# Patient Record
Sex: Female | Born: 1990 | Race: Black or African American | Hispanic: No | Marital: Single | Smoking: Never smoker
Health system: Southern US, Community
[De-identification: ages and names within clinical notes are randomized; demographics above are authoritative.]

## PROBLEM LIST (undated history)

## (undated) DIAGNOSIS — L509 Urticaria, unspecified: Secondary | ICD-10-CM

## (undated) DIAGNOSIS — D649 Anemia, unspecified: Secondary | ICD-10-CM

## (undated) DIAGNOSIS — T8859XA Other complications of anesthesia, initial encounter: Secondary | ICD-10-CM

## (undated) DIAGNOSIS — T4145XA Adverse effect of unspecified anesthetic, initial encounter: Secondary | ICD-10-CM

## (undated) DIAGNOSIS — M419 Scoliosis, unspecified: Secondary | ICD-10-CM

## (undated) HISTORY — DX: Urticaria, unspecified: L50.9

## (undated) HISTORY — PX: OTHER SURGICAL HISTORY: SHX169

---
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
## 2009-01-03 ENCOUNTER — Emergency Department (HOSPITAL_COMMUNITY): Admission: EM | Admit: 2009-01-03 | Discharge: 2009-01-03 | Payer: Self-pay | Admitting: Family Medicine

## 2009-10-04 ENCOUNTER — Emergency Department (HOSPITAL_COMMUNITY): Admission: EM | Admit: 2009-10-04 | Discharge: 2009-10-04 | Payer: Self-pay | Admitting: Emergency Medicine

## 2009-12-28 ENCOUNTER — Emergency Department (HOSPITAL_COMMUNITY): Admission: EM | Admit: 2009-12-28 | Discharge: 2009-12-28 | Payer: Self-pay | Admitting: Emergency Medicine

## 2010-01-07 ENCOUNTER — Emergency Department (HOSPITAL_COMMUNITY): Admission: EM | Admit: 2010-01-07 | Discharge: 2010-01-07 | Payer: Self-pay | Admitting: Family Medicine

## 2010-02-09 ENCOUNTER — Emergency Department (HOSPITAL_COMMUNITY): Admission: EM | Admit: 2010-02-09 | Discharge: 2010-02-09 | Payer: Self-pay | Admitting: Family Medicine

## 2010-03-08 ENCOUNTER — Emergency Department (HOSPITAL_COMMUNITY): Admission: EM | Admit: 2010-03-08 | Discharge: 2010-03-08 | Payer: Self-pay | Admitting: Family Medicine

## 2010-07-24 ENCOUNTER — Emergency Department (HOSPITAL_COMMUNITY)
Admission: EM | Admit: 2010-07-24 | Discharge: 2010-07-24 | Payer: Self-pay | Source: Home / Self Care | Admitting: Emergency Medicine

## 2010-10-14 LAB — GC/CHLAMYDIA PROBE AMP, GENITAL
Chlamydia, DNA Probe: NEGATIVE
GC Probe Amp, Genital: NEGATIVE

## 2010-10-14 LAB — POCT URINALYSIS DIPSTICK
Bilirubin Urine: NEGATIVE
Glucose, UA: NEGATIVE mg/dL
Nitrite: NEGATIVE
Protein, ur: NEGATIVE mg/dL
Specific Gravity, Urine: 1.02 (ref 1.005–1.030)
Urobilinogen, UA: 0.2 mg/dL (ref 0.0–1.0)
pH: 7.5 (ref 5.0–8.0)

## 2010-10-14 LAB — POCT PREGNANCY, URINE: Preg Test, Ur: NEGATIVE

## 2010-10-20 LAB — WET PREP, GENITAL
Trich, Wet Prep: NONE SEEN
Yeast Wet Prep HPF POC: NONE SEEN

## 2010-10-20 LAB — POCT URINALYSIS DIP (DEVICE)
Bilirubin Urine: NEGATIVE
Ketones, ur: 40 mg/dL — AB
Protein, ur: NEGATIVE mg/dL
Specific Gravity, Urine: >=1.03 (ref 1.005–1.030)
pH: 5.5 (ref 5.0–8.0)

## 2010-10-20 LAB — GC/CHLAMYDIA PROBE AMP, GENITAL: Chlamydia, DNA Probe: NEGATIVE

## 2010-10-21 LAB — GC/CHLAMYDIA PROBE AMP, GENITAL
Chlamydia, DNA Probe: NEGATIVE
GC Probe Amp, Genital: NEGATIVE
GC Probe Amp, Genital: POSITIVE — AB

## 2010-10-21 LAB — POCT URINALYSIS DIP (DEVICE)
Glucose, UA: NEGATIVE mg/dL
Hgb urine dipstick: NEGATIVE
Nitrite: NEGATIVE
Protein, ur: NEGATIVE mg/dL
Specific Gravity, Urine: 1.02 (ref 1.005–1.030)
Urobilinogen, UA: 0.2 mg/dL (ref 0.0–1.0)

## 2010-10-21 LAB — POCT PREGNANCY, URINE
Preg Test, Ur: NEGATIVE
Preg Test, Ur: NEGATIVE

## 2010-10-21 LAB — WET PREP, GENITAL: Trich, Wet Prep: NONE SEEN

## 2010-11-11 LAB — POCT URINALYSIS DIP (DEVICE)
Nitrite: POSITIVE — AB
Protein, ur: 100 mg/dL — AB
Urobilinogen, UA: 0.2 mg/dL (ref 0.0–1.0)
pH: 5.5 (ref 5.0–8.0)

## 2010-11-11 LAB — POCT PREGNANCY, URINE: Preg Test, Ur: NEGATIVE

## 2010-11-11 LAB — URINE CULTURE: Colony Count: >=100000

## 2010-11-11 LAB — GC/CHLAMYDIA PROBE AMP, GENITAL: GC Probe Amp, Genital: NEGATIVE

## 2010-12-20 NOTE — Discharge Summary (Signed)
Melanie Calderon, Melanie Calderon            ACCOUNT NO.:  [id number]   MEDICAL RECORD NO.:  [id number]          PATIENT TYPE:  INP   LOCATION:  9145                          FACILITY:  [facility]   PHYSICIAN:  Maxie Better, M.D.DATE OF BIRTH:  [birth year]   DATE OF ADMISSION:  01/18/2005  DATE OF DISCHARGE:  01/22/2005                                 DISCHARGE SUMMARY   ADMISSION DIAGNOSES:  1.  Spontaneous rupture of membranes, early labor.  2.  Intrauterine gestation at 37-1/7 weeks.  3.  Teen pregnancy.   DISCHARGE DIAGNOSES:  1.  Intrauterine gestation at 37-1/7 weeks delivered.  2.  Chorioamnionitis.  3.  Arrested dilatation.  4.  Teen pregnancy.  5.  Left paratubal cyst.  6.  Status post primary cesarean section.   PROCEDURES:  Primary cesarean section, left paratubal cyst removal   HISTORY OF PRESENT ILLNESS:  A 20 year old gravida 1, para 0, single black  female at 37-1/7 weeks who has had late prenatal care registration.  Presented at [facility] with spontaneous rupture of membranes and  early labor.   HOSPITAL COURSE:  The patient was admitted to [facility].  Her exam,  at time of admission, was 3, 80%, -3.  Blood type is O positive.  She was  rubella immune.  Her tracing showed a fetal heart rate baseline of 120,  reactivity and dysfunctional uterine pattern.  Group B strep culture was  negative.  Low dose pitocin was started.  Internal scalp electrode was  placed, and during the labor course secondary to external monitoring, showed  some late decelerations.  Pitocin was discontinued at that time.  Temperature was 100.4.  The patient was 4 cm, edematous, -3.  She was  contracting every 2-4 minutes.  Intrauterine pressure catheter was placed.  Urine culture was sent which showed no growth ultimately.  Ampicillin and  gentamycin were started.  Tylenol was given.  The patient progressed to  anterior lip, -1 and arrested at that dilatation.  With arrested  dilatation,  decision was made to proceed with a primary cesarean section.  At the time  of her symptoms, direct occiput posterior presentation of a live female  weighing 6 pounds 9 ounces with cord around his shoulder times one was  noted.  Short cord was noted.  Normal tubes and ovaries were noted.  Left  paratubal cyst was noted.  Apgars were 6 and 7.  Cord pH was 7.29.  Blood  loss was 70 cc.  Placenta was sent to pathology which confirmed  chorioamnionitis and funisitis and left paratubal cyst which had been  removed at the time of surgery, was confirmed as a paratubal cyst.  The  patient, postoperatively, was continued on her antibiotics as she was  afebrile for greater than 24 hours.  Her CBC on postop day #1 showed a  hemoglobin of 11.3, white count 15, platelets 185,000.  By postop day #3,  the patient remained afebrile, had no evidence of infection, was able to be  discharged home.  Social worker saw the patient and her mom.   DISPOSITION:  Home.   CONDITION ON  DISCHARGE:  Stable.   DISCHARGE MEDICATIONS:  1.  Tylox #30 one p.o. q.6h. p.r.n. pain.  2.  Prenatal vitamins one p.o. daily.  3.  Motrin 800 mg one p.o. q.6-8h. p.r.n. pain.   DISCHARGE INSTRUCTIONS:  1.  Postpartum booklet given.  2.  Follow up appointment in four weeks at [facility].       [place]/MEDQ  D:  02/11/2005  T:  02/11/2005  Job:  [id number]

## 2010-12-20 NOTE — H&P (Signed)
Melanie Calderon, Melanie Calderon            ACCOUNT NO.:  [id number]   MEDICAL RECORD NO.:  [id number]          PATIENT TYPE:  INP   LOCATION:  0104                          FACILITY:  [facility]   PHYSICIAN:  Lalla Brothers, MDDATE OF BIRTH:  27-May-1991   DATE OF ADMISSION:  02/10/2005  DATE OF DISCHARGE:                         PSYCHIATRIC ADMISSION ASSESSMENT   IDENTIFICATION:  This 40-1/21-year-old female, entering the 10th grade this  fall, is admitted emergently voluntarily in transfer from [facility] [phone], Darel Hong, for inpatient psychiatric stabilization  and treatment of suicide risk and depression. The patient has suicide  intent, pulling a knife to stab herself to death. She has been progressively  depressed recently, being three weeks postpartum with a teen pregnancy,  having guilty ruminations that mother considers her inadequate as mother  helps her significantly with the baby.   HISTORY OF PRESENT ILLNESS:  The father of the baby is uninvolved with the  patient seeming to sense significant rejection in that regard. The patient's  mother is retiring from [employer] and is on Seroquel and Prozac herself  for depression and anxiety. Mother also has diabetes. The patient entered  [facility] with premature rupture of the membranes concluded to be  associated with chorioamnionitis with delivery accomplished by cesarean  section for failure to progress in labor January 19, 2005. The patient had a  peritubular cyst that was also excised. She has not been eating or drinking  well recently as evidenced by ketones greater than 80 on her admission  urinalysis. The patient has restored her hemoglobin quite well but she is  not taking her prenatal vitamin. Mother encourages nutrition and hydration  in the patient but the patient seems to respond to mother's encouragement by  considering herself an inadequate mother. The patient has become  progressively  depressed. Her weight is only 117 pounds three weeks  postpartum and she continues to have postpartum bleeding. Mother may be  somewhat perfectionistic in her Engineer, agricultural style of formulating the  patient's needs and the optimal meeting of these needs. However, the patient  and mother do not open up and discuss these issues readily so that  formulation can only be initiated without definite clarification yet. The  patient predominately lays there and cries. She has reached a point of being  unable to tolerate further despair and guilty rumination. She is self-  deprecating and seems to project part of this to mother. She seems hopeless  and wants to give her baby up. Mother is currently taking care of the baby  but the patient even feels bad about that. The patient does not open up and  talk about the stress of parenting the baby. She describes that she has  actually been a good mother even though she doubts that her own mother  thinks this. The patient indicates that patient's responsibilities are  manageable but she does not feel a sense of reinforcement and recruitment  for continuing these. However, she has not been lax in her own  responsibilities until she has decided to kill herself to establish a final  answer. The  patient has inflicted eraser burns into her ankle, resulting in  the letters M and K.. She does not currently clarify who these stand for or  the name of the baby's father. She has had no previous mental health  diagnosis or treatment. Mother does not clarify definite postpartum mood  disorder herself. The patient does not use alcohol or illicit drugs. The  patient's guilty ruminations and projection that mother considers her  inadequate may be reaching delusional proportions and therefore the  differential diagnosis must consider that a postpartum psychosis. The  patient does not manifest any affective recruitment when questions about  such are raised. She denies  misperceptions or frank paranoia. She does not  acknowledge hypomanic or manic symptoms. She does not acknowledge other  specific anxiety. She has no known learning disability. She has had no known  organic central nervous system trauma otherwise.   PAST MEDICAL HISTORY:  Patient reports that she is under the primary care of  Dr. Billy Coast. Mother notes that she was on antidepressants when she was  pregnant with the patient and delivered. Mother worries that her own  depression has been a depressing influence upon the patient whether through  nurture or nature. Mother does not clarify her antidepressant from 15-16  years ago. The patient had chicken pox in the past. She has seasonal  allergic rhinitis. She has contact lenses for visual acuity difficulties.  She has some mild mid-thoracic scoliosis which has not required surgery or  bracing. She apparently had her last scoliosis x-ray in October of 2003 and  her first symptoms of scoliosis or findings were in the 35 to 21 year old  range. She had conjunctivitis February 10, 2004, treated in the emergency  department and she had to leave her contacts out for several days. She is  now three weeks postpartum from a C-section delivery January 19, 2005 of her  first pregnancy with baby reportedly doing well. She delivered at 37-1/[redacted]  weeks gestation, having apparently limited prenatal care. She reportedly had  premature spontaneous rupture of the membranes possibly associated with  chorioamnionitis and she did receive ampicillin and gentamicin at the time  of induction of labor and had C-section for failure to progress in labor.  She has no medication allergies. She is on no current medication though she  should be taking her prenatal vitamin postpartum. She did have some Tylox  and Motrin postpartum for C-section pain.   REVIEW OF SYSTEMS:  The patient has had no seizure or syncope. She has had no heart murmur or arrhythmia. She has had no organic central  nervous system  trauma. She has had no rash, jaundice or purpura. She has had no known  exposure to communicable disease or toxins. She has no difficulty with gait,  gaze, or continence. She has no headaches, chest pain, palpitations or  presyncope. She has had no cough, congestion or dyspnea. She has had no  abdominal pain, nausea, vomiting or diarrhea lately. She has had no dysuria  or arthralgia.   IMMUNIZATIONS:  Up-to-date.   FAMILY HISTORY:  The patient lives with mother who has diabetes and seems to  have anxiety and depression. Mother is treated by Dr. Hortencia Pilar and  therapist, Madaline Savage, including with Seroquel and Prozac. Father of the  baby is uninvolved. They do not acknowledge any information about the  patient's father. Mother is retiring from the [employer]. The patient feels  that mother is devaluing the patient's own parenting of her baby, now 30  weeks old. The patient also is devaluing of herself.   SOCIAL AND DEVELOPMENTAL HISTORY:  The patient is entering the 10th grade  this fall. Mother indicates the patient has been hanging out with adults  that she considers stressful. The patient denies the use of cigarettes,  alcohol or illicit drugs. She does not acknowledge the current status of her  sexual activity.   ASSETS:  The patient is intelligent.   MENTAL STATUS EXAM:  Height is 61 inches and weight is 117.5 pounds. Blood  pressure 109/67 with heart rate of 73 (supine) and standing blood pressure  is 101/65 with heart rate of 123. The patient has an intact neurological  exam. She is right-handed. Alternating motion rates are intact. Muscle  strengths and tone are normal. Cranial nerves are intact and speech is  normal. She offers a paucity of spontaneous verbal participation in the  interview and intervention. Deep tendon reflexes are 0/0. There are no  pathologic reflexes or soft neurologic findings. There are no abnormal  involuntary movements. Gait and gaze are  intact. The patient has severe  dysphoria with melancholic features. She has guilty ruminations, suicide  fixation and ideation, hopelessness, and severe dysphoria. The patient has  postpartum features. She has a teenage pregnancy. Her own father seems  absent and the father of the baby is absence. She projects that mother  considers the patient worthless as a mother and inadequate. Mother is trying  to help the patient parent. The patient seems overwhelmed as well as  possibly exhausted emotionally and physically. She is not eating or drinking  adequately. She denies other specific anxiety. Projection and guilty  ruminations approach delusion but there is no definite postpartum psychosis.  She has suicidal ideation and plan. She is not homicidal or assaultive.   IMPRESSION:   AXIS I:  1.  Major depression, single episode, severe with melancholic and postpartum      features. 2.  Rule out postpartum psychosis (provisional diagnosis).  3.  Parent-child problem.  4.  Other interpersonal problem.  5.  Other specified family circumstances.   AXIS II:  Diagnosis deferred.   AXIS III:  1.  Three weeks postpartum C-section delivery with incidental excision of      peritubular cyst.  2.  Premature spontaneous rupture of membranes due to chorioamnionitis,      treated with IV antibiotics at the time of delivery.  3.  Seasonal allergic rhinitis.  4.  Undernutrition and hydration.   AXIS IV:  Stressors:  Family--moderate, acute and chronic; teen pregnancy  and medical--severe, acute; phase of life--severe, acute.   AXIS V:  Global Assessment of Functioning on admission 28 with highest in  last year estimated 78.   PLAN:  The patient is admitted for inpatient adolescent psychiatric and  multidisciplinary multimodal behavioral health treatment in a team-based  program at a locked psychiatric unit. We will start Prozac pharmacotherapy  and prenatal vitamin again. Seroquel can be added if  any definite postpartum  psychosis is documented. Cognitive behavioral therapy, parenting training,  family therapy, restructuring of peer group, communication and social  skills, and interpersonal therapy can be undertaken.   ESTIMATED LENGTH OF STAY:  Seven to eight days with target symptoms for  discharge being stabilization of suicide risk and mood, stabilization of  guilty ruminations and confidence in ability to parent, and generalization  of the capacity for safe, effective participation in outpatient treatment.       GEJ/MEDQ  D:  02/11/2005  T:  02/11/2005  Job:  [id number]

## 2010-12-20 NOTE — Discharge Summary (Signed)
Melanie Calderon, Melanie Calderon            ACCOUNT NO.:  [id number]   MEDICAL RECORD NO.:  [id number]          PATIENT TYPE:  INP   LOCATION:  0104                          FACILITY:  [facility]   PHYSICIAN:  Lalla Brothers, MDDATE OF BIRTH:  [birth year]   DATE OF ADMISSION:  02/10/2005  DATE OF DISCHARGE:  02/17/2005                                 DISCHARGE SUMMARY   IDENTIFICATION:  This 34[age]1/21-year-old female who enters the tenth grade this  fall at [employer] was admitted emergently voluntarily  in transfer from [facility] for inpatient  psychiatric stabilization and treatment of suicide risk and depression. The  patient had pulled a knife to stab herself reporting suicide intent having  guilty ruminations of being considered an inadequate mother and deciding  that she wishes give her baby up instead of having it taken away because  mother considered her incapable. The patient was three weeks postpartum  having a teen pregnancy, delivered by cesarean section with the father of  the baby uninvolved. In many ways this recapitulated for the patient and  mother that the patient's father was never involved when mother delivered  the patient. For full details please see the typed admission assessment.   SYNOPSIS OF PRESENT ILLNESS:  The patient denied any significant mental  health history although mother has a significant history herself. At the  time of admission, there was concern that the patient's projection,  disorganization and self-deprecation were reaching delusional proportion  raising concern for postpartum psychosis. The patient seemed historically to  be projecting that inadequacy to mother when the patient herself might have  self- doubt and fell overwhelmed with responsibilities. However mother  gradually clarifies that she herself takes Seroquel and Prozac seeing Dr.  Hortencia Pilar and therapist Madaline Savage at [facility]  Mother  had been on Zyprexa prior to Seroquel stopping it because of the concerns  over possible metabolic syndrome as side effect and the fact that mother has  diabetes. Mother has worried that her own depression will have a depressing  influence on the patient. 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 The patient was in  [employer]. Her grades are generally Bs and Cs. Mother stated on admission the  patient was a normal girl, and the patient's daughter is named Acupuncturist.  Mother suggested that she herself was diagnosed with paranoid schizophrenia  in 84. Mother met the patient's father in the psychiatric unit of the [facility] but did not know his diagnosis. The patient has a cousin with a  large head and paralysis. The patient herself has scoliosis and seasonal  allergic rhinitis. At the time of admission, she has undernutrition and  relative dehydration postpartum.   INITIAL MENTAL STATUS EXAM:  The patient has severe dysphoria with  melancholic features. She had guilty ruminations, suicide fixation,  hopelessness and severe dysphoria. The patient herself did not have definite  overt psychosis but there is concern about early delusions and these  symptoms could be shared with mother. Mother started  antidepressants 15-16  years ago as well as antipsychotics in 1991. The patient was overwhelmed and  somewhat emotionally and physically exhausted. Mother acknowledge that the  patient had not been eating or drinking well. The patient had the suicide  plan with a knife that was very alienating to mother.   LABORATORY FINDINGS:  CBC on admission revealed white count borderline low  at 4600 with lower limit of normal 4800 with 12% monocytes with upper limit  of normal 9 and 6% eosinophils with upper limit of normal 5. Hemoglobin was  normal at 13, MCV at 82 and platelet count 248,000. RDW was elevated 21.7  with upper limit of normal  14.6 and she did have some ovalocytes. TSH was  slightly low at 0.315 with reference range 0.35-5.5. Free T4 was normal at  1.04 with reference range 0.89-1.8. There were no clinical findings  suggestive of Sheehan's. Basic metabolic panel was normal though BUN was low  at 4  with reference range 6-23. Sodium was normal 142, potassium 3.5,  fasting glucose 85, CO2 25, creatinine 0.90, calcium 9.3. Albumin was  borderline low at 3.5 with total protein 6.6 and AST 25 and ALT 15. GGT was  normal at 11. Urinalysis revealed ketones greater than 80 mg/dL with a small  amount of bilirubin and specific gravity of 1.031 on admission with protein  of 30. There was a small amount leukocyte esterase with 0-2 WBC and  amorphous urate crystals. RPR was nonreactive and urine probe for gonorrhea  and chlamydia trichomatous by up by DNA amplification were both negative.  HIV was negative. Urine drug screen was negative with creatinine of 486  mg/dL.   HOSPITAL COURSE AND TREATMENT:  General medical exam by Mallie Darting PA-C  noted no medication allergies. The patient had menarche at age 58 and  acknowledges sexual activity. Menses have been regular in the past, and she  is now 3 weeks postpartum C-section for premature rupture of membranes with  chorioamnionitis and failure to progress in labor requiring Pitocin and then  C-section. The patient had an incidental peritubular cyst removed at C-  section. She had some initials carved to her ankle. She has some striae on  the abdomen and thighs. She was Tanner stage V. Admission weight was 117-1/2  pounds with discharge weight 122 pounds. Admission blood pressure was 109/67  with heart rate of 73 supine and 101/65 with heart rate of 123 standing.  Discharge blood pressure was 106/70 with heart rate of 83 supine and 107/65  with heart rate of 119 standing. Vital signs were normal throughout hospital stay. The patient was started on Prozac 20 milligrams daily in  the morning,  and she required medication for sleep and was prescribed lorazepam as mother  uses this at home p.r.n. She tolerated lorazepam well asking for a double  dose but then acknowledging that she could also work on sleep induction and  regulation psychotherapeutically. No other substance abuse was evident. Her  prenatal vitamin was continued throughout the hospital stay. She required no  seclusion or restraint during hospital stay. She participated in all  modalities of treatment. She cried and withdrew for the first couple of days  of treatment and wanted out of the hospital. She then insisted that she had  to stay a few more days on the final day of hospitalization. Mother and  patient participated in family therapy with eventual documentation that  mother's paranoia and distrust of others is unfounded relative to the  patient's parenting. The patient receives support predominately from a  female friend named Aurea Graff of whom mother establishes disapproval. However,  the patient needs the support and nurturing of Aurea Graff for the patient's own  parenting. The patient was afraid that mother was going to take the baby  away from her and thereby threatened to give the baby up and to die by  pulling the knife. The patient worked through all of these issues, though  mother was only able to partially worked through the issues. The patient  concluded before mother's arrival on the day of discharge that she did need  to go home but then mother wanted the patient to stay in the hospital  because she had pulled the knife before admission. All of these things were  worked through though mother was only able to partially resolve the issues.  She did agree to the patient talked with mother's therapist particular about  the need for support from the friend named Aurea Graff. Mother was encouraged to  continue her own psychotherapy and medication management intensively in  order to establish mother's capacity  to parent the patient and the patient  to parent the child. There is every evidence that the patient was parenting  safely and appropriately on the visitation of the baby during the hospital  stay. At the time of delivery, community support in that regard with  established. At this time there is no indication that more support as needed  though mother does need more participation in her own mental health  treatment a [facility] for more stabilization of mother's  symptoms which become the source of the patient's doubt of her own  parenting. There is no evidence of postpartum psychosis.   FINAL DIAGNOSES:   AXIS I:  1.  Major depression, single episode, severe with melancholic features.  2.  Parent-child problem.  3.  Other interpersonal problem.  4.  Other specified family circumstances.   AXIS II:  No diagnosis.   AXIS III:  1.  Three weeks postpartum C-section delivery. 2.  Premature spontaneous rupture of membranes due to chorioamnionitis      treated with IV antibiotics at delivery.  3.  Seasonal allergic rhinitis.  4.  Iron undernutrition and relative dehydration on admission.  5.  Borderline low TSH postpartum with no clinical evidence of pituitary      insufficiency   AXIS IV:  Stressors family severe, acute and chronic; medical moderate to  severe, acute; phase of life severe, acute.   AXIS V:  Global assessment of function on admission 28 with highest in last  year estimated 78 and discharge global assessment of function was 56.   PLAN:  The patient was discharged mother improved condition on the following  medications.  1.  Fluoxetine 20 milligrams every morning, quantity #30 with one refill      prescribed.  2.  Lorazepam 1 milligram at bedtime if needed for insomnia, agitation or      anxiety, quantity #30 with one refill prescribed.  3.  Prenatal vitamin one daily on home supply. The family will be working      with [employer] of the  [place] regarding community support      services [phone] or [employer] [phone] for optimal home-based support      services. The patient has an appointment at [facility]      with Clayborn Bigness February 19, 2005 at 1530, and psychiatric appointment  can be scheduled from that. She apparently has postpartum appointment at      [facility] and infertility office of Maxie Better, MD. She      continues her prenatal vitamin one daily own home supply. She follows a      regular diet and has no restrictions on physical activity other than      those postpartum. She has crisis and safety plans if needed. Mother is      encouraged to see her own therapist and allow the patient to go along as      well as to update medication management.       GEJ/MEDQ  D:  02/18/2005  T:  02/18/2005  Job:  [id number]   cc:   Yvonne Kendall [employer]  [address]  [place], [place] [address]   Maxie Better, MD  [facility]  [address]  [place], [place]  YNW [phone]

## 2010-12-20 NOTE — Op Note (Signed)
Melanie Calderon, Melanie Calderon            ACCOUNT NO.:  [id number]   MEDICAL RECORD NO.:  [id number]          PATIENT TYPE:  INP   LOCATION:  9145                          FACILITY:  [facility]   PHYSICIAN:  Maxie Better, M.D.DATE OF BIRTH:  [birth year]   DATE OF PROCEDURE:  01/19/2005  DATE OF DISCHARGE:                                 OPERATIVE REPORT   PREOPERATIVE DIAGNOSIS:  Arrest of dilatation, term gestation,  chorioamnionitis.   POSTOPERATIVE DIAGNOSIS:  Arrest of dilatation, direct occipitoposterior  presentation, term gestation, chorioamnionitis, left paratubal cyst.   PROCEDURE:  Primary cesarean section, Kerr hysterotomy, left paratubal  cystectomy.   ANESTHESIA:  Failed epidural, spinal.  General anesthesia.   SURGEON:  Maxie Better, M.D.   ASSISTANT:  Gerri Spore B. Earlene Plater, M.D.   INDICATIONS FOR PROCEDURE:  A 20 year old gravida 1, para 0, female at [redacted]  weeks gestation who had arrest of dilatation at 9 cm, and is now being  placed for a primary cesarean section.  During the course of her labor, the  patient had a temperature spike to 100.4 for which she subsequently received  ampicillin and gentamicin.  The patient had spontaneous rupture of membranes  and early labor on her initial presentation.  Risks and benefits of the  cesarean section were reviewed with the patient and her mother.  Consent has  been signed.  The patient was transferred to the operating room.  Clindamycin was added to the antibiotic regimen.   DESCRIPTION OF PROCEDURE:  The patient was placed in the supine position  with a left lateral tilt.  Indwelling Foley catheter was already in place.  Repeat testing of the epidural showed not good anesthetic level for surgical  procedure.  The patient was subsequently placed in the upright position and  a spinal anesthesia was attempted on several occasions by Dorinda Hill T. Pamalee Leyden,  M.D. without success.  The decision was made to proceed with general  anesthesia.  The patient was then placed back in the supine position with  left lateral tilt.  She was sterilely prepped and draped in the usual  fashion.  The general anesthesia was then induced without incident.  A  Pfannenstiel skin incision was then made, carried down to the rectus fascia.  The rectus fascia was incised in the midline and extended bilaterally.  The  rectus fascia was then bluntly and sharply dissected off the rectus muscle  in superior and inferior fashion.  The rectus muscles were split in the  midline.  The parietoperitoneum was entered bluntly.  A well-developed lower  uterine segment was noted.  A curvilinear low transverse uterine incision  was made after the vesicouterine peritoneum was delivered.  The bladder was  bluntly dissected off the lower uterine segment.  The uterine incision was  extended bilaterally using bandage scissors.  Subsequent delivery of a live  female from the direct occipitoposterior presentation with a large caput was  accomplished.  The baby had a cord around the shoulder which was clamped,  cut, and the baby was subsequently delivered and transferred to the awaiting  pediatricians who assigned April of 6 and 7 at  1 and 5 minutes.  Cord pH was  obtained which was 7.29.  The placenta was noted to have a short cord.  The  placenta was spontaneous and intact and sent to pathology.  The uterine  cavity was cleaned of debris.  The uterine incision had no extension.  The  uterine incision was closed with 0 Monocryl in a running locking stitch for  the first layer, the second layer was imbricated using 0 Monocryl suture.  Good hemostasis was noted along the incision line.  Normal ovaries were  noted bilaterally.  The left tube had a paratubal cyst which was removed  using cautery.  The right tube was normal as was the left otherwise.  The  abdomen was copiously irrigated and suctioned of debris.  The pericolic  gutters were cleaned of debris.  The  parietoperitoneum was then closed with  2-0 Vicryl running locking stitch.  The rectus fascia was closed with 0  Vicryl x2.  The subcutaneous area was irrigated and small bleeders  cauterized and the skin approximated using Ethicon staples.   SPECIMENS:  Placenta and left paratubal cyst sent to pathology.   ESTIMATED BLOOD LOSS:  700 mL.   FLUIDS REPLACED:  2100 mL crystalloid.   URINE OUTPUT:  400 mL urine.   COUNTS:  Needle, sponge, and instrument counts correct x2.   COMPLICATIONS:  None.  The patient tolerated the procedure well and was  transferred to the recovery room in stable condition.       [place]/MEDQ  D:  01/19/2005  T:  01/20/2005  Job:  [id number]

## 2011-03-12 ENCOUNTER — Emergency Department (HOSPITAL_COMMUNITY)
Admission: EM | Admit: 2011-03-12 | Discharge: 2011-03-13 | Disposition: A | Discharge status: Home / Self Care | Payer: Self-pay | Attending: Emergency Medicine | Admitting: Emergency Medicine

## 2011-03-12 DIAGNOSIS — R300 Dysuria: Secondary | ICD-10-CM | POA: Insufficient documentation

## 2011-03-12 DIAGNOSIS — R109 Unspecified abdominal pain: Secondary | ICD-10-CM | POA: Insufficient documentation

## 2011-03-12 DIAGNOSIS — N898 Other specified noninflammatory disorders of vagina: Secondary | ICD-10-CM | POA: Insufficient documentation

## 2011-03-13 LAB — URINALYSIS, ROUTINE W REFLEX MICROSCOPIC
Glucose, UA: NEGATIVE mg/dL
Hgb urine dipstick: NEGATIVE
Protein, ur: NEGATIVE mg/dL
Specific Gravity, Urine: 1.026 (ref 1.005–1.030)
pH: 6.5 (ref 5.0–8.0)

## 2011-03-13 LAB — URINE MICROSCOPIC-ADD ON

## 2011-03-13 LAB — WET PREP, GENITAL

## 2011-06-11 ENCOUNTER — Emergency Department (HOSPITAL_COMMUNITY)
Admission: EM | Admit: 2011-06-11 | Discharge: 2011-06-12 | Discharge status: Against Medical Advice | Attending: Emergency Medicine | Admitting: Emergency Medicine

## 2011-06-11 ENCOUNTER — Encounter: Payer: Self-pay | Admitting: Emergency Medicine

## 2011-06-11 DIAGNOSIS — R109 Unspecified abdominal pain: Secondary | ICD-10-CM | POA: Insufficient documentation

## 2011-06-11 HISTORY — DX: Anemia, unspecified: D64.9

## 2011-06-11 NOTE — ED Notes (Signed)
PT. REPORTS LOW ABDOMINAL CRAMPING ONSET TODAY , HOME PREG-TEST POSITIVE 2 DAYS AGO .

## 2011-06-12 ENCOUNTER — Emergency Department (HOSPITAL_COMMUNITY): Payer: Medicaid Other

## 2011-06-12 ENCOUNTER — Emergency Department (HOSPITAL_COMMUNITY)
Admission: EM | Admit: 2011-06-12 | Discharge: 2011-06-12 | Disposition: A | Discharge status: Home / Self Care | Payer: Medicaid Other | Attending: Emergency Medicine | Admitting: Emergency Medicine

## 2011-06-12 ENCOUNTER — Encounter (HOSPITAL_COMMUNITY): Payer: Self-pay | Admitting: *Deleted

## 2011-06-12 ENCOUNTER — Other Ambulatory Visit (HOSPITAL_COMMUNITY): Payer: Self-pay

## 2011-06-12 DIAGNOSIS — O26899 Other specified pregnancy related conditions, unspecified trimester: Secondary | ICD-10-CM

## 2011-06-12 DIAGNOSIS — R10819 Abdominal tenderness, unspecified site: Secondary | ICD-10-CM | POA: Insufficient documentation

## 2011-06-12 DIAGNOSIS — N949 Unspecified condition associated with female genital organs and menstrual cycle: Secondary | ICD-10-CM | POA: Insufficient documentation

## 2011-06-12 DIAGNOSIS — O99891 Other specified diseases and conditions complicating pregnancy: Secondary | ICD-10-CM | POA: Insufficient documentation

## 2011-06-12 HISTORY — DX: Scoliosis, unspecified: M41.9

## 2011-06-12 LAB — HCG, QUANTITATIVE, PREGNANCY: hCG, Beta Chain, Quant, S: 75701 m[IU]/mL — ABNORMAL HIGH (ref <5)

## 2011-06-12 LAB — ABO/RH: ABO/RH(D): O POS

## 2011-06-12 LAB — CBC
MCH: 29 pg (ref 26.0–34.0)
MCHC: 34.3 g/dL (ref 30.0–36.0)
Platelets: 238 10*3/uL (ref 150–400)
RBC: 3.96 MIL/uL (ref 3.87–5.11)

## 2011-06-12 LAB — DIFFERENTIAL
Basophils Relative: 1 % (ref 0–1)
Eosinophils Absolute: 0.1 10*3/uL (ref 0.0–0.7)
Lymphs Abs: 1.4 10*3/uL (ref 0.7–4.0)
Neutro Abs: 4.6 10*3/uL (ref 1.7–7.7)
Neutrophils Relative %: 69 % (ref 43–77)

## 2011-06-12 LAB — POCT PREGNANCY, URINE: Preg Test, Ur: POSITIVE

## 2011-06-12 NOTE — ED Notes (Signed)
Pt states "took a pregnancy test last week, I know I'm pregnant but I wanted to see when I was due and get blood work drawn, also I've been cramping"

## 2011-06-12 NOTE — ED Notes (Signed)
Pt called 3 times in waiting room without answer. Pt LWBS

## 2011-06-12 NOTE — ED Provider Notes (Signed)
History     CSN: [id number] Arrival date & time: 06/12/2011  9:04 AM   First MD Initiated Contact with Patient 06/12/11 0957      Chief Complaint  Patient presents with  . Abdominal Cramping    (Consider location/radiation/quality/duration/timing/severity/associated sxs/prior treatment) HPI Comments: Had positive pregnancy test last week, now with lower abdominal cramping, but no vaginal discharge or bleeding.  No bowel or bladder complaints.   Patient is a 20 y.o. female presenting with cramps. The history is provided by the patient.  Abdominal Cramping The primary symptoms of the illness include abdominal pain. The primary symptoms of the illness do not include fever, nausea, vomiting, diarrhea, dysuria, vaginal discharge or vaginal bleeding. The current episode started yesterday. The problem has been gradually worsening.  The patient states that she believes she is currently pregnant. The patient has not had a change in bowel habit. Symptoms associated with the illness do not include chills, constipation, urgency, hematuria, frequency or back pain.    Past Medical History  Diagnosis Date  . Anemia   . Scoliosis   . Anemia     Past Surgical History  Procedure Date  . Caesa   . Cesarean section     History reviewed. No pertinent family history.  History  Substance Use Topics  . Smoking status: Former Games developer  . Smokeless tobacco: Former Neurosurgeon    Quit date: 03/12/2011  . Alcohol Use: Yes     occassional wine    OB History    Grav Para Term Preterm Abortions TAB SAB Ect Mult Living   2 1              Review of Systems  Constitutional: Negative for fever and chills.  Gastrointestinal: Positive for abdominal pain. Negative for nausea, vomiting, diarrhea and constipation.  Genitourinary: Negative for dysuria, urgency, frequency, hematuria, vaginal bleeding and vaginal discharge.  Musculoskeletal: Negative for back pain.  All other systems reviewed and are  negative.    Allergies  Review of patient's allergies indicates no known allergies.  Home Medications  No current outpatient prescriptions on file.  BP 105/72  Pulse 84  Temp(Src) 97.4 F (36.3 C) (Oral)  Resp 20  Wt 129 lb (58.514 kg)  SpO2 100%  LMP 04/11/2011  Physical Exam  Nursing note and vitals reviewed. Constitutional: She is oriented to person, place, and time. She appears well-developed and well-nourished. No distress.  HENT:  Head: Normocephalic and atraumatic.  Neck: Normal range of motion. Neck supple.  Cardiovascular: Normal rate and regular rhythm.  Exam reveals no gallop and no friction rub.   No murmur heard. Pulmonary/Chest: Effort normal and breath sounds normal. No respiratory distress. She has no wheezes.  Abdominal: Soft. Bowel sounds are normal. She exhibits no distension.       There is mild ttp in the suprapubic region.    Musculoskeletal: Normal range of motion.  Neurological: She is alert and oriented to person, place, and time.  Skin: Skin is warm and dry. She is not diaphoretic.    ED Course  Procedures (including critical care time)   Labs Reviewed  POCT PREGNANCY, URINE  POCT PREGNANCY, URINE  CBC  DIFFERENTIAL  ABO/RH  HCG, QUANTITATIVE, PREGNANCY   No results found.   No diagnosis found.    MDM  US shows IUP with no complications.  Will discharge to home.  Follow up with ob.          Geoffery Lyons, MD 06/12/11 1329

## 2011-06-24 ENCOUNTER — Encounter (HOSPITAL_COMMUNITY): Payer: Self-pay | Admitting: Emergency Medicine

## 2011-07-31 ENCOUNTER — Emergency Department (HOSPITAL_COMMUNITY): Payer: Medicaid Other

## 2011-07-31 ENCOUNTER — Emergency Department (HOSPITAL_COMMUNITY)
Admission: EM | Admit: 2011-07-31 | Discharge: 2011-07-31 | Disposition: A | Discharge status: Home / Self Care | Payer: Medicaid Other | Attending: Emergency Medicine | Admitting: Emergency Medicine

## 2011-07-31 ENCOUNTER — Encounter (HOSPITAL_COMMUNITY): Payer: Self-pay | Admitting: Emergency Medicine

## 2011-07-31 DIAGNOSIS — J111 Influenza due to unidentified influenza virus with other respiratory manifestations: Secondary | ICD-10-CM | POA: Insufficient documentation

## 2011-07-31 DIAGNOSIS — R509 Fever, unspecified: Secondary | ICD-10-CM | POA: Insufficient documentation

## 2011-07-31 DIAGNOSIS — R197 Diarrhea, unspecified: Secondary | ICD-10-CM | POA: Insufficient documentation

## 2011-07-31 DIAGNOSIS — R1110 Vomiting, unspecified: Secondary | ICD-10-CM | POA: Insufficient documentation

## 2011-07-31 DIAGNOSIS — J3489 Other specified disorders of nose and nasal sinuses: Secondary | ICD-10-CM | POA: Insufficient documentation

## 2011-07-31 DIAGNOSIS — R05 Cough: Secondary | ICD-10-CM | POA: Insufficient documentation

## 2011-07-31 DIAGNOSIS — R070 Pain in throat: Secondary | ICD-10-CM | POA: Insufficient documentation

## 2011-07-31 DIAGNOSIS — R059 Cough, unspecified: Secondary | ICD-10-CM | POA: Insufficient documentation

## 2011-07-31 DIAGNOSIS — O26859 Spotting complicating pregnancy, unspecified trimester: Secondary | ICD-10-CM | POA: Insufficient documentation

## 2011-07-31 DIAGNOSIS — R51 Headache: Secondary | ICD-10-CM | POA: Insufficient documentation

## 2011-07-31 DIAGNOSIS — IMO0001 Reserved for inherently not codable concepts without codable children: Secondary | ICD-10-CM | POA: Insufficient documentation

## 2011-07-31 DIAGNOSIS — R5383 Other fatigue: Secondary | ICD-10-CM | POA: Insufficient documentation

## 2011-07-31 DIAGNOSIS — R109 Unspecified abdominal pain: Secondary | ICD-10-CM | POA: Insufficient documentation

## 2011-07-31 DIAGNOSIS — R5381 Other malaise: Secondary | ICD-10-CM | POA: Insufficient documentation

## 2011-07-31 DIAGNOSIS — R0789 Other chest pain: Secondary | ICD-10-CM | POA: Insufficient documentation

## 2011-07-31 DIAGNOSIS — N898 Other specified noninflammatory disorders of vagina: Secondary | ICD-10-CM | POA: Insufficient documentation

## 2011-07-31 LAB — URINALYSIS, ROUTINE W REFLEX MICROSCOPIC
Leukocytes, UA: NEGATIVE
Nitrite: NEGATIVE
Protein, ur: NEGATIVE mg/dL
Urobilinogen, UA: 0.2 mg/dL (ref 0.0–1.0)

## 2011-07-31 LAB — CBC
MCHC: 34.8 g/dL (ref 30.0–36.0)
RDW: 13.4 % (ref 11.5–15.5)

## 2011-07-31 LAB — POCT I-STAT, CHEM 8
Glucose, Bld: 78 mg/dL (ref 70–99)
HCT: 39 % (ref 36.0–46.0)
Hemoglobin: 13.3 g/dL (ref 12.0–15.0)
Potassium: 3.8 mEq/L (ref 3.5–5.1)
Sodium: 137 mEq/L (ref 135–145)

## 2011-07-31 LAB — INFLUENZA PANEL BY PCR (TYPE A & B)
Influenza A By PCR: NEGATIVE
Influenza B By PCR: NEGATIVE

## 2011-07-31 LAB — WET PREP, GENITAL: Yeast Wet Prep HPF POC: NONE SEEN

## 2011-07-31 MED ORDER — OSELTAMIVIR PHOSPHATE 75 MG PO CAPS: 75.0000 mg | ORAL_CAPSULE | Freq: Two times a day (BID) | ORAL | Status: AC

## 2011-07-31 NOTE — ED Notes (Signed)
Pt presents to ER with several complaints; initial c/o flu-like symptoms x 1 week (cough, sore throat, sneezing, body aches, chills); also states "I'm [redacted] weeks pregnant and have been having cramping that is getting worse with spotting for about 3 weeks. My insurance isnt in effect yet so I have not seen my OB. I did go to [facility] in [place] and they put me on bedrest."

## 2011-07-31 NOTE — ED Provider Notes (Signed)
Medical screening examination/treatment/procedure(s) were performed by non-physician practitioner and as supervising physician I was immediately available for consultation/collaboration.   Dayton Bailiff, MD 07/31/11 1257

## 2011-07-31 NOTE — ED Provider Notes (Signed)
History     CSN: [id number]  Arrival date & time 07/31/11  0905   First MD Initiated Contact with Patient 07/31/11 [phone]      Chief Complaint  Patient presents with  . Cough  . Sore Throat  . Generalized Body Aches  . Chills  . Fever  . Abdominal Cramping  . Vaginal Bleeding    (Consider location/radiation/quality/duration/timing/severity/associated sxs/prior treatment) HPI Comments: Pt presents to the ED with complaints of flu-like symptoms of cough, congestion, sore throat, muscle aches, chills, fevers, headaches, abdominal pain, vomiting, diarrhea. The patient states that the symptoms started 5-7 days ago.  Pt has been around other sick contacts and did not get the flu shot this year. The patient denies headaches, neck pain, weakness, vision changes, severe abdominal pain, inability to eat or drink, difficulty breathing, SOB, wheezing, chest pain. The patient has not tried any OTC meds bc [redacted] wks pregnant.    Past Medical History  Diagnosis Date  . Anemia   . Scoliosis   . Anemia     Past Surgical History  Procedure Date  . Caesa   . Cesarean section     No family history on file.  History  Substance Use Topics  . Smoking status: Former Games developer  . Smokeless tobacco: Former Neurosurgeon    Quit date: 03/12/2011  . Alcohol Use: No     occassional wine    OB History    Grav Para Term Preterm Abortions TAB SAB Ect Mult Living   3 1              Review of Systems  Constitutional: Positive for fever, chills and fatigue.  HENT: Negative for ear pain, congestion, sore throat, rhinorrhea, sneezing, neck pain, neck stiffness, sinus pressure and tinnitus.   Eyes: Negative for visual disturbance.  Respiratory: Positive for cough and chest tightness.   Cardiovascular: Negative for chest pain and palpitations.  Gastrointestinal: Positive for abdominal pain (mild pain on sides ). Negative for nausea, vomiting and diarrhea.  Genitourinary: Positive for vaginal bleeding (spotting  for 2 weeks. No lg amts of blood or clots. ). Negative for dysuria, hematuria and vaginal discharge.  Musculoskeletal: Positive for myalgias.  Skin: Negative for color change and rash.  Neurological: Negative for dizziness and weakness.  Hematological: Does not bruise/bleed easily.  Psychiatric/Behavioral: Negative for confusion.  All other systems reviewed and are negative.    Allergies  Review of patient's allergies indicates no known allergies.  Home Medications  No current outpatient prescriptions on file.  BP 103/59  Pulse 96  Temp(Src) 98.7 F (37.1 C) (Oral)  Resp 18  SpO2 97%  LMP 04/15/2011  Physical Exam  Nursing note and vitals reviewed. Constitutional: She is oriented to person, place, and time. She appears well-developed and well-nourished. No distress.  HENT:  Head: Normocephalic and atraumatic.  Right Ear: External ear normal.  Left Ear: External ear normal.  Nose: Nose normal.  Mouth/Throat: Oropharynx is clear and moist. No oropharyngeal exudate.  Eyes: Conjunctivae and EOM are normal.  Neck: Normal range of motion. Neck supple.  Cardiovascular: Normal rate, regular rhythm and normal heart sounds.   Pulmonary/Chest: Effort normal and breath sounds normal. No accessory muscle usage or stridor. Not tachypneic. No respiratory distress. She has no wheezes. She has no rhonchi. She has no rales.  Abdominal: Soft. She exhibits no distension. There is no tenderness.       Gravid.  Genitourinary: No breast tenderness, discharge or bleeding. Pelvic exam was performed  with patient prone. There is no lesion on the right labia. There is no lesion on the left labia. No tenderness or bleeding around the vagina. Vaginal discharge (Thin white dc, malodorous) found.  Musculoskeletal: Normal range of motion.  Lymphadenopathy:    She has no cervical adenopathy.  Neurological: She is alert and oriented to person, place, and time.  Skin: Skin is warm and dry. She is not  diaphoretic.    ED Course  Procedures (including critical care time)   Labs Reviewed  I-STAT, CHEM 8  CBC  URINALYSIS, ROUTINE W REFLEX MICROSCOPIC  WET PREP, GENITAL  GC/CHLAMYDIA PROBE AMP, GENITAL  HCG, QUANTITATIVE, PREGNANCY  INFLUENZA PANEL BY PCR   No results found.   No diagnosis found.    MDM  Flu like symptoms  Abdominal cramping (15 wks preg)  Rh positive last mo, Cervical Os closed on PE, Transvaginal US shows no abnormalities. Pt tx with Tamiflu and reccommended to follow up with OBGYN. Flu PCR pending.       [place], [place] 07/31/11 1126

## 2011-08-01 LAB — GC/CHLAMYDIA PROBE AMP, GENITAL: GC Probe Amp, Genital: NEGATIVE

## 2013-08-26 IMAGING — US US OB TRANSVAGINAL
1 series · 13 of 28 positions shown · non-contrast
Comparison: None.

CLINICAL DATA: Pregnant with pelvic pain

OBSTETRIC <14 WK US AND TRANSVAGINAL OB US
TECHNIQUE: Both transabdominal and transvaginal ultrasound
examinations were performed for complete evaluation of the
gestation as well as the maternal uterus, adnexal regions, and
pelvic cul-de-sac.  Transvaginal technique was performed to assess
early pregnancy.

[Series 1: us ob transvaginal · 13 of 53 slices shown]
[im 2/53]
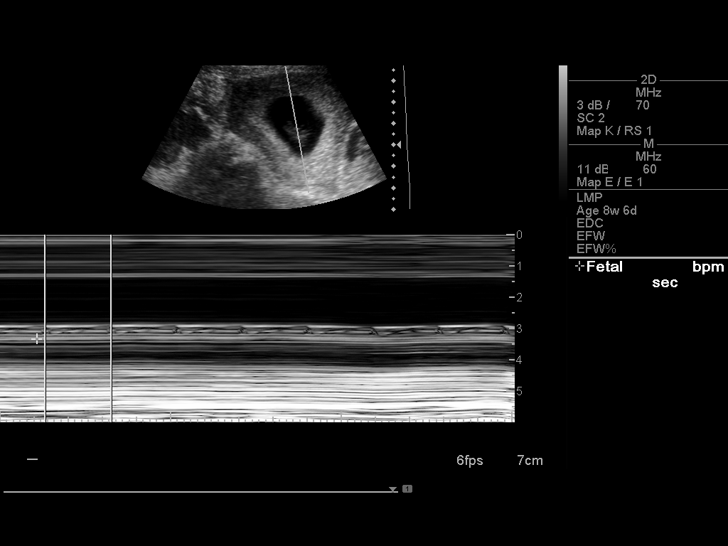
[im 6/53]
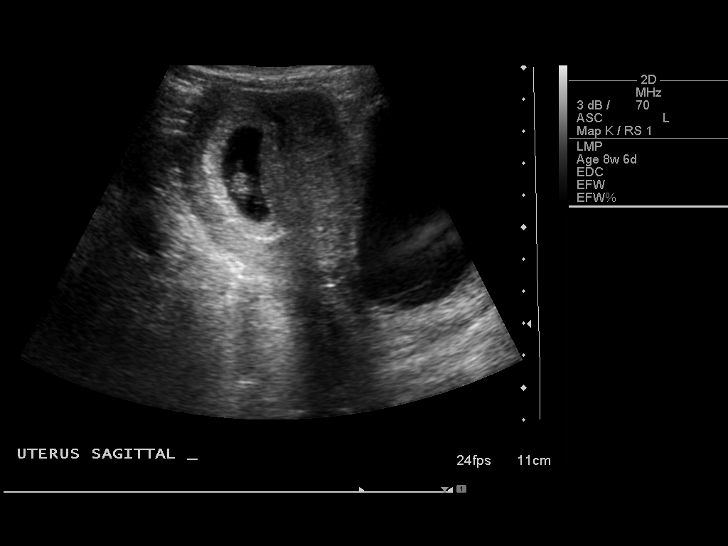
[im 10/53]
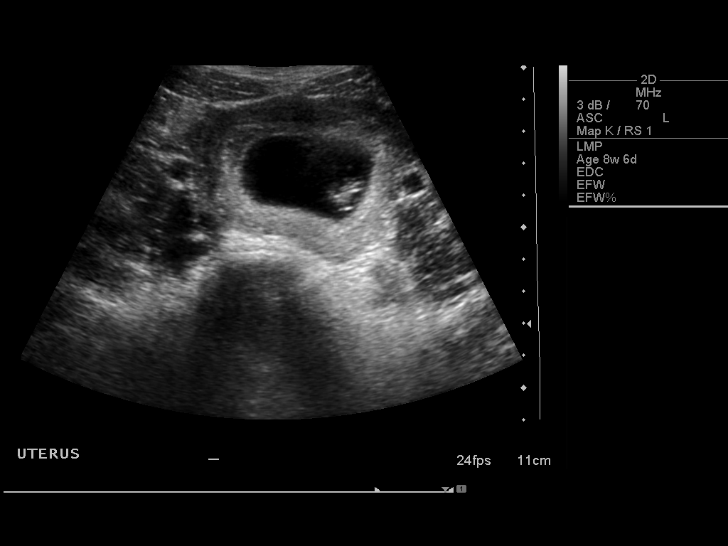
[im 14/53]
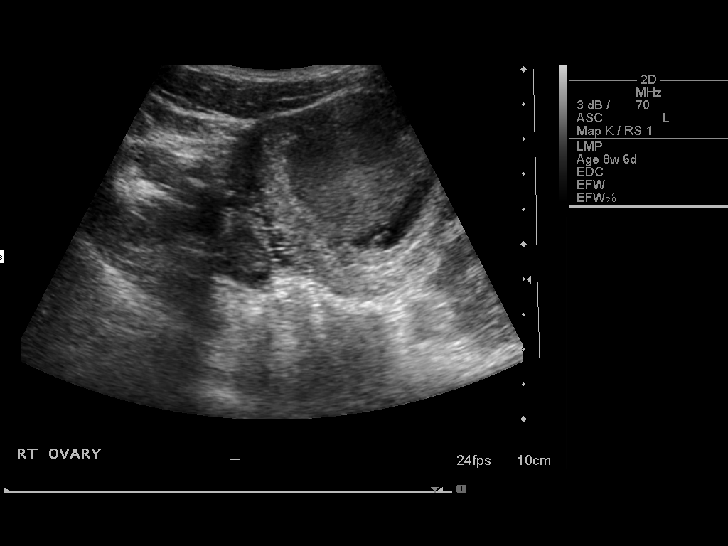
[im 18/53]
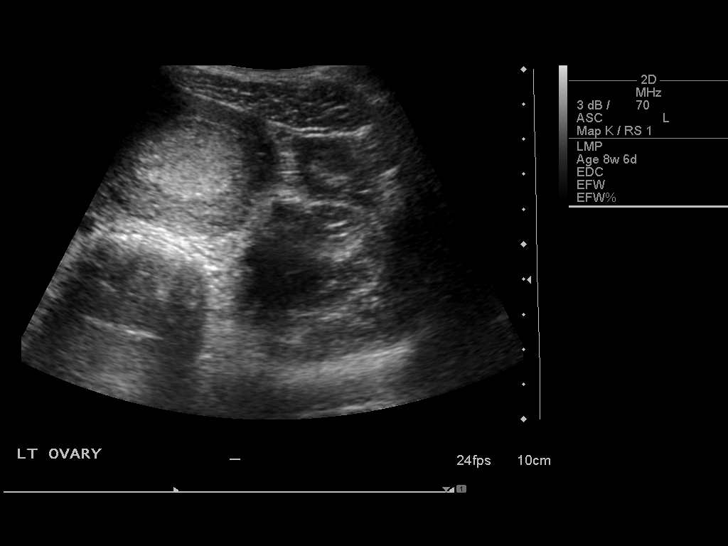
[im 22/53]
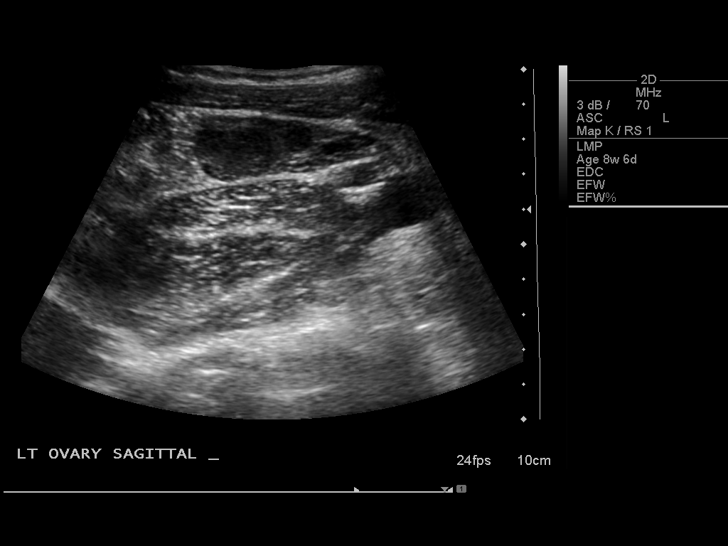
[im 27/53]
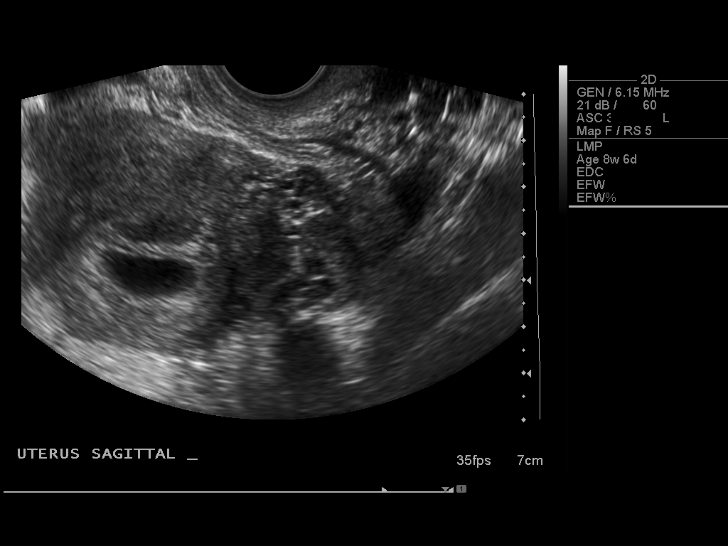
[im 31/53]
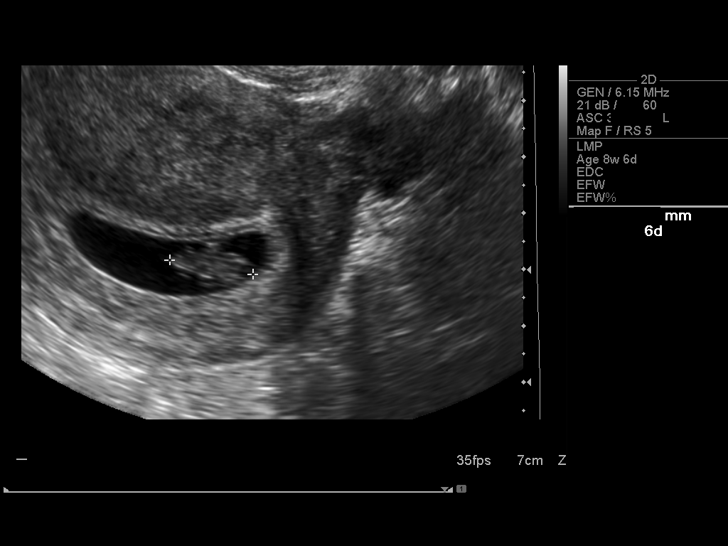
[im 35/53]
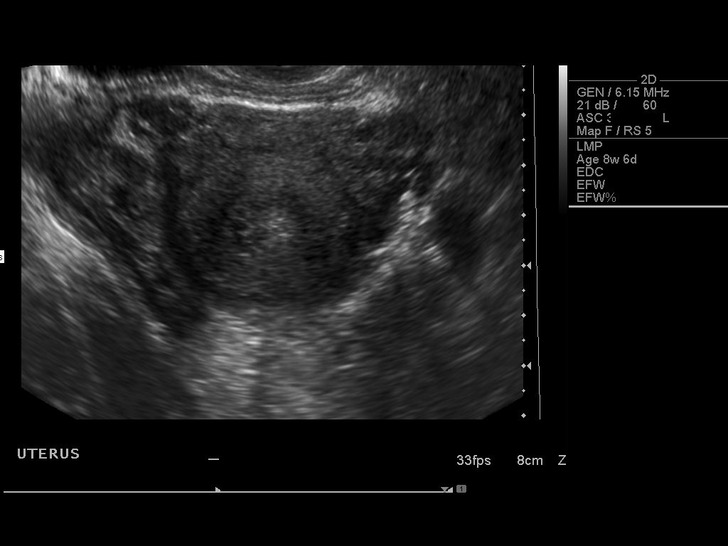
[im 39/53]
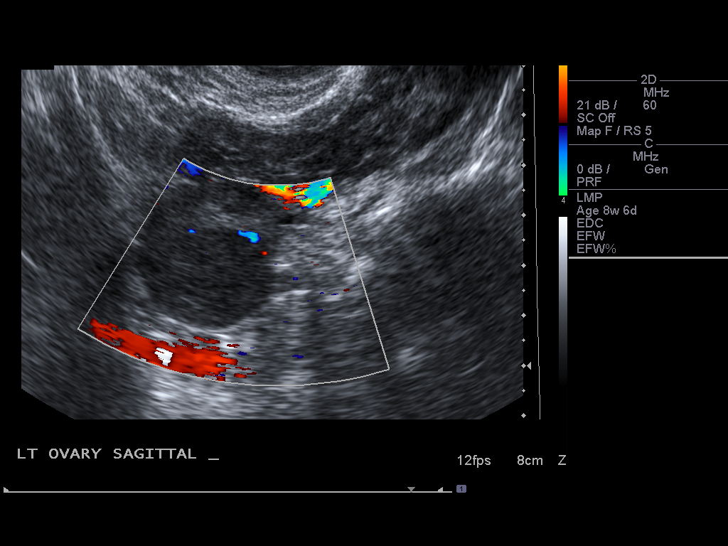
[im 43/53]
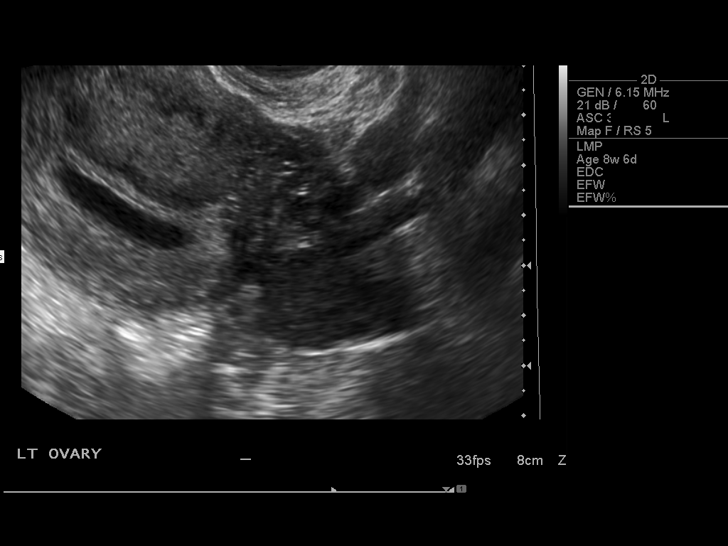
[im 47/53]
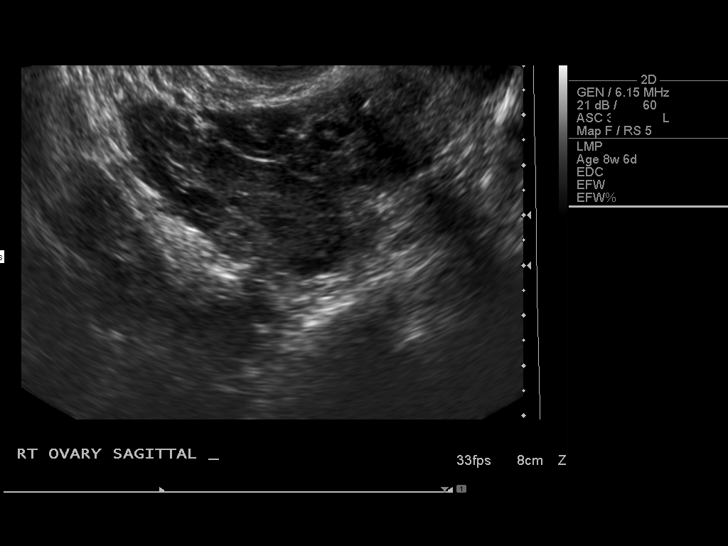
[im 51/53]
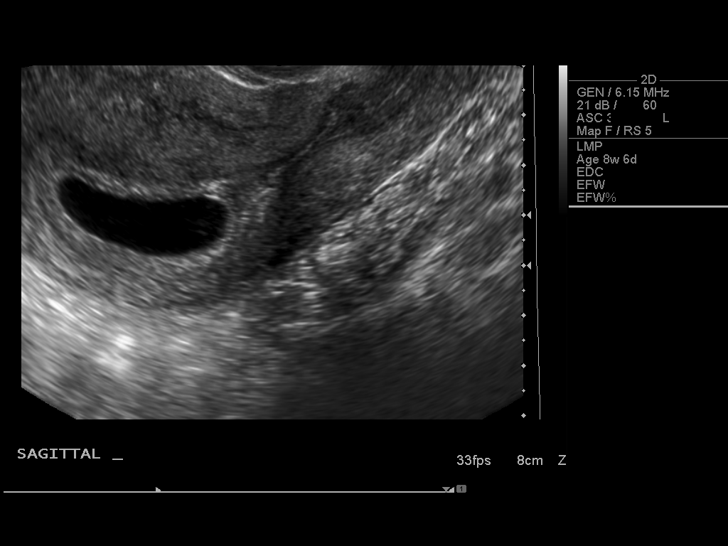

[13 of 28 positions shown; findings below may reference images not displayed]

Intrauterine gestational sac:  Visualized/normal in shape.
Yolk sac: Seen
Embryo: Seen
Cardiac Activity: Seen
Heart Rate: 158 bpm

MSD:   mm      w     d
CRL: 14.9   mm  7   w  6   d        US EDC: 01/23/2012

Maternal uterus/adnexae:
The right ovary has a normal appearance measuring 2.0 x 4.2 x
cm.  The left ovary measures 2.7 x 2.8 x 2.5 cm and contains a
hypoechoic area with peripheral flow and increased through
transmission most compatible with a hemorrhagic corpus luteum
measuring 2.5 x 2.1 x 1.9 cm.  A small amount of simple free fluid
is noted in the cul-de-sac.
IMPRESSION: Single living intrauterine pregnancy demonstrating an estimated
gestational age by crown-rump length of 7 weeks 6 days with
corresponding EDC of 01/23/2012.

Probable hemorrhagic left corpus luteum cyst.  This can be
reassessed at the time of anatomic evaluation. Normal right ovary.

## 2014-03-12 ENCOUNTER — Emergency Department (HOSPITAL_COMMUNITY)
Admission: EM | Admit: 2014-03-12 | Discharge: 2014-03-12 | Disposition: A | Discharge status: Home / Self Care | Payer: Medicaid Other | Attending: Emergency Medicine | Admitting: Emergency Medicine

## 2014-03-12 ENCOUNTER — Encounter (HOSPITAL_COMMUNITY): Payer: Self-pay | Admitting: Emergency Medicine

## 2014-03-12 DIAGNOSIS — N760 Acute vaginitis: Secondary | ICD-10-CM | POA: Insufficient documentation

## 2014-03-12 DIAGNOSIS — Z3202 Encounter for pregnancy test, result negative: Secondary | ICD-10-CM | POA: Insufficient documentation

## 2014-03-12 DIAGNOSIS — Z8739 Personal history of other diseases of the musculoskeletal system and connective tissue: Secondary | ICD-10-CM | POA: Diagnosis not present

## 2014-03-12 DIAGNOSIS — L293 Anogenital pruritus, unspecified: Secondary | ICD-10-CM | POA: Insufficient documentation

## 2014-03-12 DIAGNOSIS — A499 Bacterial infection, unspecified: Secondary | ICD-10-CM | POA: Insufficient documentation

## 2014-03-12 DIAGNOSIS — Z87891 Personal history of nicotine dependence: Secondary | ICD-10-CM | POA: Insufficient documentation

## 2014-03-12 DIAGNOSIS — B9689 Other specified bacterial agents as the cause of diseases classified elsewhere: Secondary | ICD-10-CM | POA: Diagnosis not present

## 2014-03-12 DIAGNOSIS — Z862 Personal history of diseases of the blood and blood-forming organs and certain disorders involving the immune mechanism: Secondary | ICD-10-CM | POA: Diagnosis not present

## 2014-03-12 LAB — URINALYSIS, ROUTINE W REFLEX MICROSCOPIC
Bilirubin Urine: NEGATIVE
GLUCOSE, UA: NEGATIVE mg/dL
HGB URINE DIPSTICK: NEGATIVE
KETONES UR: NEGATIVE mg/dL
Nitrite: NEGATIVE
PH: 6 (ref 5.0–8.0)
PROTEIN: NEGATIVE mg/dL
Specific Gravity, Urine: 1.021 (ref 1.005–1.030)
Urobilinogen, UA: 0.2 mg/dL (ref 0.0–1.0)

## 2014-03-12 LAB — URINE MICROSCOPIC-ADD ON

## 2014-03-12 LAB — WET PREP, GENITAL
TRICH WET PREP: NONE SEEN
Yeast Wet Prep HPF POC: NONE SEEN

## 2014-03-12 LAB — RPR

## 2014-03-12 LAB — HIV ANTIBODY (ROUTINE TESTING W REFLEX): HIV 1&2 Ab, 4th Generation: NONREACTIVE

## 2014-03-12 LAB — POC URINE PREG, ED: PREG TEST UR: NEGATIVE

## 2014-03-12 MED ORDER — METRONIDAZOLE 500 MG PO TABS: 500.0000 mg | ORAL_TABLET | Freq: Two times a day (BID) | ORAL | Status: DC

## 2014-03-12 NOTE — ED Notes (Signed)
She c/o vaginal itching with occasional "feels like period cramps with some spotting".  She denies fever, nor any other sign of recent illness and is in no distress.

## 2014-03-12 NOTE — ED Provider Notes (Signed)
CSN: [id number]     Arrival date & time 03/12/14  1408 History   First MD Initiated Contact with Patient 03/12/14 1505     Chief Complaint  Patient presents with  . Vaginal Itching     (Consider location/radiation/quality/duration/timing/severity/associated sxs/prior Treatment) The history is provided by the patient.    Patient presents with three weeks of vaginal discomfort.  Discomfort described as burning and itching with abnormal malodorous cloudy discharge.  Also burns externally when she urinates and increased urinary frequency.  LMP 3 weeks ago, on time and normal with exception of spotting after end of period.  Did have some suprapubic cramping for a few days with associated diarrhea at that time that has resolved. She did change tampons during this last period and also changed to a new soap after the symptoms began that made her symptoms worse.  Is sexually active with one female partner who uses condoms most of the time.  She believes that he is also monogamous. Denies fevers, N/V.   Past Medical History  Diagnosis Date  . Anemia   . Scoliosis   . Anemia    Past Surgical History  Procedure Laterality Date  . Caesa    . Cesarean section     No family history on file. History  Substance Use Topics  . Smoking status: Former Games developer  . Smokeless tobacco: Former Neurosurgeon    Quit date: 03/12/2011  . Alcohol Use: No     Comment: occassional wine   OB History   Grav Para Term Preterm Abortions TAB SAB Ect Mult Living   3 1             Review of Systems  All other systems reviewed and are negative.     Allergies  Review of patient's allergies indicates no known allergies.  Home Medications   Prior to Admission medications   Not on File   BP 122/74  Pulse 83  Temp(Src) 98.8 F (37.1 C) (Oral)  Resp 17  SpO2 100%  LMP 02/23/2014  Breastfeeding? Unknown Physical Exam  Nursing note and vitals reviewed. Constitutional: She appears well-developed and well-nourished.  No distress.  HENT:  Head: Normocephalic and atraumatic.  Neck: Neck supple.  Abdominal: Soft. She exhibits no distension. There is no tenderness. There is no rebound and no guarding.  Genitourinary: Uterus is not tender. Cervix exhibits no motion tenderness. Right adnexum displays no mass, no tenderness and no fullness. Left adnexum displays no mass, no tenderness and no fullness. There is tenderness around the vagina. No erythema or bleeding around the vagina. No foreign body around the vagina. No signs of injury around the vagina. Vaginal discharge found.  Neurological: She is alert.  Skin: She is not diaphoretic.    ED Course  Procedures (including critical care time) Labs Review Labs Reviewed  WET PREP, GENITAL - Abnormal; Notable for the following:    Clue Cells Wet Prep HPF POC MANY (*)    WBC, Wet Prep HPF POC FEW (*)    All other components within normal limits  URINALYSIS, ROUTINE W REFLEX MICROSCOPIC - Abnormal; Notable for the following:    APPearance CLOUDY (*)    Leukocytes, UA MODERATE (*)    All other components within normal limits  URINE MICROSCOPIC-ADD ON - Abnormal; Notable for the following:    Squamous Epithelial / LPF MANY (*)    Bacteria, UA FEW (*)    All other components within normal limits  GC/CHLAMYDIA PROBE AMP  URINE CULTURE  RPR  HIV ANTIBODY (ROUTINE TESTING)  POC URINE PREG, ED    Imaging Review No results found.   EKG Interpretation None      MDM   Final diagnoses:  Bacterial vaginosis    Afebrile nontoxic patient with abnormal vaginal discharge and vaginal discomfort x 3 weeks.  No abdominal tenderness.  Bimanual exam unremarkable. Wet prep shows clue cells.  Will treat for BV.  UA shows moderate leukocytes and 21-50 WBC but many squamous cells - likely vaginal contaminant.  Will send for culture.  Discussed result, findings, treatment, and follow up  with patient.  Pt given return precautions.  Pt verbalizes understanding and agrees  with plan.        Trixie Dredgemily Hiawatha Dressel, PA-C 03/12/14 1849

## 2014-03-12 NOTE — Discharge Instructions (Signed)
Read the information below.  Use the prescribed medication as directed.  Please discuss all new medications with your pharmacist.  You may return to the Emergency Department at any time for worsening condition or any new symptoms that concern you.  If you develop high fevers, abdominal pain, uncontrolled vomiting, or are unable to tolerate fluids by mouth, return to the ER for a recheck.      Bacterial Vaginosis Bacterial vaginosis is a vaginal infection that occurs when the normal balance of bacteria in the vagina is disrupted. It results from an overgrowth of certain bacteria. This is the most common vaginal infection in women of childbearing age. Treatment is important to prevent complications, especially in pregnant women, as it can cause a premature delivery. CAUSES  Bacterial vaginosis is caused by an increase in harmful bacteria that are normally present in smaller amounts in the vagina. Several different kinds of bacteria can cause bacterial vaginosis. However, the reason that the condition develops is not fully understood. RISK FACTORS Certain activities or behaviors can put you at an increased risk of developing bacterial vaginosis, including:  Having a new sex partner or multiple sex partners.  Douching.  Using an intrauterine device (IUD) for contraception. Women do not get bacterial vaginosis from toilet seats, bedding, swimming pools, or contact with objects around them. SIGNS AND SYMPTOMS  Some women with bacterial vaginosis have no signs or symptoms. Common symptoms include:  Grey vaginal discharge.  A fishlike odor with discharge, especially after sexual intercourse.  Itching or burning of the vagina and vulva.  Burning or pain with urination. DIAGNOSIS  Your health care provider will take a medical history and examine the vagina for signs of bacterial vaginosis. A sample of vaginal fluid may be taken. Your health care provider will look at this sample under a microscope  to check for bacteria and abnormal cells. A vaginal pH test may also be done.  TREATMENT  Bacterial vaginosis may be treated with antibiotic medicines. These may be given in the form of a pill or a vaginal cream. A second round of antibiotics may be prescribed if the condition comes back after treatment.  HOME CARE INSTRUCTIONS   Only take over-the-counter or prescription medicines as directed by your health care provider.  If antibiotic medicine was prescribed, take it as directed. Make sure you finish it even if you start to feel better.  Do not have sex until treatment is completed.  Tell all sexual partners that you have a vaginal infection. They should see their health care provider and be treated if they have problems, such as a mild rash or itching.  Practice safe sex by using condoms and only having one sex partner. SEEK MEDICAL CARE IF:   Your symptoms are not improving after 3 days of treatment.  You have increased discharge or pain.  You have a fever. MAKE SURE YOU:   Understand these instructions.  Will watch your condition.  Will get help right away if you are not doing well or get worse. FOR MORE INFORMATION  Centers for Disease Control and Prevention, Division of STD Prevention: SolutionApps.co.zawww.cdc.gov/std American Sexual Health Association (ASHA): www.ashastd.org  Document Released: 07/21/2005 Document Revised: 05/11/2013 Document Reviewed: 03/02/2013 [facility] Patient Information 2015 [place], [place]. This information is not intended to replace advice given to you by your health care provider. Make sure you discuss any questions you have with your health care provider.

## 2014-03-13 NOTE — ED Provider Notes (Signed)
Medical screening examination/treatment/procedure(s) were performed by non-physician practitioner and as supervising physician I was immediately available for consultation/collaboration.   EKG Interpretation None        Richardean Canalavid H Gilford Lardizabal, MD 03/13/14 1746

## 2014-03-14 LAB — GC/CHLAMYDIA PROBE AMP
CT Probe RNA: NEGATIVE
GC Probe RNA: NEGATIVE

## 2014-03-15 LAB — URINE CULTURE: Colony Count: >=100000

## 2014-03-16 ENCOUNTER — Telehealth (HOSPITAL_BASED_OUTPATIENT_CLINIC_OR_DEPARTMENT_OTHER): Payer: Self-pay | Admitting: Emergency Medicine

## 2014-03-16 NOTE — Progress Notes (Signed)
ED Antimicrobial Stewardship Positive Culture Follow Up   Melanie Calderon is an 23 y.o. female [facility] with a chief complaint of  Chief Complaint  Patient presents with  . Vaginal Itching    Recent Results (from the past 720 hour(s))  URINE CULTURE     Status: None   Collection Time    03/12/14  3:22 PM      Result Value Ref Range Status   Specimen Description URINE, CLEAN CATCH   Final   Special Requests NONE   Final   Culture  Setup Time     Final   Value: 03/12/2014 23:09     [facility]formed at [facility]   Colony Count     Final   Value: >=100,000 COLONIES/ML     [facility]   Culture     Final   Value: STAPHYLOCOCCUS AUREUS     Note: RIFAMPIN AND GENTAMICIN SHOULD NOT BE USED AS SINGLE DRUGS FOR TREATMENT OF STAPH INFECTIONS.     Performed at [facility]   Report Status 03/15/2014 FINAL   Final   Organism ID, Bacteria STAPHYLOCOCCUS AUREUS   Final  WET PREP, GENITAL     Status: Abnormal   Collection Time    03/12/14  3:54 PM      Result Value Ref Range Status   Yeast Wet Prep HPF POC NONE SEEN  NONE SEEN Final   Trich, Wet Prep NONE SEEN  NONE SEEN Final   Clue Cells Wet Prep HPF POC MANY (*) NONE SEEN Final   WBC, Wet Prep HPF POC FEW (*) NONE SEEN Final  GC/CHLAMYDIA PROBE AMP     Status: None   Collection Time    03/12/14  3:55 PM      Result Value Ref Range Status   CT Probe RNA NEGATIVE  NEGATIVE Final   GC Probe RNA NEGATIVE  NEGATIVE Final   Comment: (NOTE)                                                                                               **Normal Reference Range: Negative**          Assay performed using the Gen-Probe APTIMA COMBO2 (R) Assay.     Acceptable specimen types for this assay include APTIMA Swabs (Unisex,     endocervical, urethral, or vaginal), first void urine, and ThinPrep     liquid based cytology samples.     Performed at [facility]    [x]  Treated  with metronidazole 500 mg PO BID x 7 days but, organism resistant to prescribed antimicrobial  Discussed this case with ID Physician Dr. Orvan Falconerampbell.  Will call the patient and see if she is still having any symptoms, fever and how she generally feels. If patient still feeling bad, will refer her to the ED.  ED Provider: Cliffton AstersJohn Campbell, MD   Newton PiggStewart, Cassie L 03/16/2014, 12:10 PM Infectious Diseases Pharmacist Phone# [phone]

## 2014-03-16 NOTE — Telephone Encounter (Signed)
Post ED Visit - Positive Culture Follow-up: Successful Patient Follow-Up  Culture assessed and recommendations reviewed by: []  Wes Kathryne Erikssonulaney, Pharm.D., BCPS []  Celedonio MiyamotoJeremy Frens, Pharm.D., BCPS []  Georgina PillionElizabeth Martin, [address] BoulevardPharm[address]D., BCPS []  LunenburgMinh Pham, [place]D., BCPS, AAHIVP []  Estella HuskMichelle Turner, Pharm.D., BCPS, AAHIVP []  Red ChristiansSamson Lee, Pharm.D. [x]  Cassie Roseanne RenoStewart, VermontPharm.D.  Positive urine culture  Call patient for symptom check. If patient symptomatic, return to ED.  03/16/14 @ 1600  -- left voicemail to call flow manager #   Jiles HaroldGammons, Melanie Calderon 03/16/2014, 4:00 PM

## 2014-03-17 ENCOUNTER — Telehealth (HOSPITAL_BASED_OUTPATIENT_CLINIC_OR_DEPARTMENT_OTHER): Payer: Self-pay | Admitting: Emergency Medicine

## 2014-05-29 ENCOUNTER — Emergency Department (HOSPITAL_COMMUNITY)
Admission: EM | Admit: 2014-05-29 | Discharge: 2014-05-30 | Disposition: A | Discharge status: Home / Self Care | Payer: Medicaid Other | Attending: Emergency Medicine | Admitting: Emergency Medicine

## 2014-05-29 DIAGNOSIS — Z87891 Personal history of nicotine dependence: Secondary | ICD-10-CM | POA: Insufficient documentation

## 2014-05-29 DIAGNOSIS — N760 Acute vaginitis: Secondary | ICD-10-CM | POA: Diagnosis not present

## 2014-05-29 DIAGNOSIS — Z79899 Other long term (current) drug therapy: Secondary | ICD-10-CM | POA: Diagnosis not present

## 2014-05-29 DIAGNOSIS — D649 Anemia, unspecified: Secondary | ICD-10-CM | POA: Insufficient documentation

## 2014-05-29 DIAGNOSIS — Z8739 Personal history of other diseases of the musculoskeletal system and connective tissue: Secondary | ICD-10-CM | POA: Diagnosis not present

## 2014-05-29 DIAGNOSIS — N898 Other specified noninflammatory disorders of vagina: Secondary | ICD-10-CM | POA: Diagnosis present

## 2014-05-29 DIAGNOSIS — Z3202 Encounter for pregnancy test, result negative: Secondary | ICD-10-CM | POA: Diagnosis not present

## 2014-05-29 DIAGNOSIS — B9689 Other specified bacterial agents as the cause of diseases classified elsewhere: Secondary | ICD-10-CM

## 2014-05-29 LAB — URINALYSIS, ROUTINE W REFLEX MICROSCOPIC
BILIRUBIN URINE: NEGATIVE
GLUCOSE, UA: NEGATIVE mg/dL
HGB URINE DIPSTICK: NEGATIVE
Ketones, ur: NEGATIVE mg/dL
NITRITE: NEGATIVE
PH: 6.5 (ref 5.0–8.0)
Protein, ur: NEGATIVE mg/dL
Specific Gravity, Urine: 1.027 (ref 1.005–1.030)
Urobilinogen, UA: 0.2 mg/dL (ref 0.0–1.0)

## 2014-05-29 LAB — WET PREP, GENITAL
TRICH WET PREP: NONE SEEN
Yeast Wet Prep HPF POC: NONE SEEN

## 2014-05-29 LAB — BASIC METABOLIC PANEL
ANION GAP: 10 (ref 5–15)
BUN: 15 mg/dL (ref 6–23)
CO2: 24 mEq/L (ref 19–32)
Calcium: 9 mg/dL (ref 8.4–10.5)
Chloride: 104 mEq/L (ref 96–112)
Creatinine, Ser: 1.03 mg/dL (ref 0.50–1.10)
GFR, EST AFRICAN AMERICAN: 87 mL/min — AB (ref >90)
GFR, EST NON AFRICAN AMERICAN: 75 mL/min — AB (ref >90)
Glucose, Bld: 95 mg/dL (ref 70–99)
POTASSIUM: 4.2 meq/L (ref 3.7–5.3)
Sodium: 138 mEq/L (ref 137–147)

## 2014-05-29 LAB — URINE MICROSCOPIC-ADD ON

## 2014-05-29 LAB — POC URINE PREG, ED: Preg Test, Ur: NEGATIVE

## 2014-05-29 NOTE — ED Provider Notes (Signed)
CSN: [id number]     Arrival date & time 05/29/14  1913 History   First MD Initiated Contact with Patient 05/29/14 2116     Chief Complaint  Patient presents with  . Vaginal Discharge     (Consider location/radiation/quality/duration/timing/severity/associated sxs/prior Treatment) HPI Comments: Patient presents to the emergency department with chief complaint of vaginal discharge. She was seen here approximately 2 months ago for same. She states that her symptoms are persistent. She states that she was prescribed Flagyl, but never took the medicine, because she has a reaction to it. She denies any associated fevers, chills, or abdominal pain. Denies any dysuria or hematuria.  The history is provided by the patient. No language interpreter was used.    Past Medical History  Diagnosis Date  . Anemia   . Scoliosis   . Anemia    Past Surgical History  Procedure Laterality Date  . Caesa    . Cesarean section     No family history on file. History  Substance Use Topics  . Smoking status: Former Games developermoker  . Smokeless tobacco: Former NeurosurgeonUser    Quit date: 03/12/2011  . Alcohol Use: No     Comment: occassional wine   OB History   Grav Para Term Preterm Abortions TAB SAB Ect Mult Living   3 1             Review of Systems  Constitutional: Negative for fever and chills.  Respiratory: Negative for shortness of breath.   Cardiovascular: Negative for chest pain.  Gastrointestinal: Negative for nausea, vomiting, diarrhea and constipation.  Genitourinary: Positive for vaginal discharge. Negative for dysuria.  All other systems reviewed and are negative.     Allergies  Review of patient's allergies indicates no known allergies.  Home Medications   Prior to Admission medications   Medication Sig Start Date End Date Taking? Authorizing Provider  CRANBERRY PO Take 1 tablet by mouth 3 (three) times daily.   Yes Historical Provider, MD  ferrous sulfate 325 (65 FE) MG tablet Take 325  mg by mouth daily with breakfast.   Yes Historical Provider, MD  Multiple Vitamin (MULTIVITAMIN WITH MINERALS) TABS tablet Take 1 tablet by mouth daily.   Yes Historical Provider, MD   BP 112/70  Pulse 101  Temp(Src) 98.2 F (36.8 C) (Oral)  Resp 18  Ht 5\' 1"  (1.549 m)  Wt 128 lb (58.06 kg)  BMI 24.20 kg/m2  SpO2 100%  LMP 05/15/2014 Physical Exam  Nursing note and vitals reviewed. Constitutional: She is oriented to person, place, and time. She appears well-developed and well-nourished.  HENT:  Head: Normocephalic and atraumatic.  Eyes: Conjunctivae and EOM are normal. Pupils are equal, round, and reactive to light.  Neck: Normal range of motion. Neck supple.  Cardiovascular: Normal rate and regular rhythm.  Exam reveals no gallop and no friction rub.   No murmur heard. Pulmonary/Chest: Effort normal and breath sounds normal. No respiratory distress. She has no wheezes. She has no rales. She exhibits no tenderness.  Abdominal: Soft. She exhibits no distension and no mass. There is no tenderness. There is no rebound and no guarding.  No focal abdominal tenderness, no RLQ tenderness or pain at McBurney's point, no RUQ tenderness or Murphy's sign, no left-sided abdominal tenderness, no fluid wave, or signs of peritonitis   Genitourinary:  Pelvic exam chaperoned by female ER tech, moderate vaginal discharge, no bleeding, no CMT or friability, no foreign body, no injury to the external genitalia, no other significant  findings   Musculoskeletal: Normal range of motion. She exhibits no edema and no tenderness.  Neurological: She is alert and oriented to person, place, and time.  Skin: Skin is warm and dry.  Psychiatric: She has a normal mood and affect. Her behavior is normal. Judgment and thought content normal.    ED Course  Procedures (including critical care time) Results for orders placed during the hospital encounter of 05/29/14  WET PREP, GENITAL      Result Value Ref Range    Yeast Wet Prep HPF POC NONE SEEN  NONE SEEN   Trich, Wet Prep NONE SEEN  NONE SEEN   Clue Cells Wet Prep HPF POC MODERATE (*) NONE SEEN   WBC, Wet Prep HPF POC FEW (*) NONE SEEN  CBC WITH DIFFERENTIAL      Result Value Ref Range   WBC 8.4  4.0 - 10.5 K/uL   RBC 4.40  3.87 - 5.11 MIL/uL   Hemoglobin 9.2 (*) 12.0 - 15.0 g/dL   HCT 16.130.8 (*) 09.636.0 - 04.546.0 %   MCV 70.0 (*) 78.0 - 100.0 fL   MCH 20.9 (*) 26.0 - 34.0 pg   MCHC 29.9 (*) 30.0 - 36.0 g/dL   RDW 40.921.6 (*) 81.111.5 - 91.415.5 %   Platelets 275  150 - 400 K/uL   Neutrophils Relative % 37 (*) 43 - 77 %   Lymphocytes Relative 48 (*) 12 - 46 %   Monocytes Relative 10  3 - 12 %   Eosinophils Relative 4  0 - 5 %   Basophils Relative 1  0 - 1 %   Neutro Abs 3.1  1.7 - 7.7 K/uL   Lymphs Abs 4.1 (*) 0.7 - 4.0 K/uL   Monocytes Absolute 0.8  0.1 - 1.0 K/uL   Eosinophils Absolute 0.3  0.0 - 0.7 K/uL   Basophils Absolute 0.1  0.0 - 0.1 K/uL   RBC Morphology ELLIPTOCYTES    BASIC METABOLIC PANEL      Result Value Ref Range   Sodium 138  137 - 147 mEq/L   Potassium 4.2  3.7 - 5.3 mEq/L   Chloride 104  96 - 112 mEq/L   CO2 24  19 - 32 mEq/L   Glucose, Bld 95  70 - 99 mg/dL   BUN 15  6 - 23 mg/dL   Creatinine, Ser 7.821.03  0.50 - 1.10 mg/dL   Calcium 9.0  8.4 - 95.610.5 mg/dL   GFR calc non Af Amer 75 (*) >90 mL/min   GFR calc Af Amer 87 (*) >90 mL/min   Anion gap 10  5 - 15  URINALYSIS, ROUTINE W REFLEX MICROSCOPIC      Result Value Ref Range   Color, Urine YELLOW  YELLOW   APPearance CLOUDY (*) CLEAR   Specific Gravity, Urine 1.027  1.005 - 1.030   pH 6.5  5.0 - 8.0   Glucose, UA NEGATIVE  NEGATIVE mg/dL   Hgb urine dipstick NEGATIVE  NEGATIVE   Bilirubin Urine NEGATIVE  NEGATIVE   Ketones, ur NEGATIVE  NEGATIVE mg/dL   Protein, ur NEGATIVE  NEGATIVE mg/dL   Urobilinogen, UA 0.2  0.0 - 1.0 mg/dL   Nitrite NEGATIVE  NEGATIVE   Leukocytes, UA SMALL (*) NEGATIVE  URINE MICROSCOPIC-ADD ON      Result Value Ref Range   Squamous Epithelial / LPF  FEW (*) RARE   WBC, UA 7-10  <3 WBC/hpf   RBC / HPF 0-2  <3 RBC/hpf  Bacteria, UA FEW (*) RARE   Urine-Other MUCOUS PRESENT    POC URINE PREG, ED      Result Value Ref Range   Preg Test, Ur NEGATIVE  NEGATIVE      EKG Interpretation None      MDM   Final diagnoses:  BV (bacterial vaginosis)    Patient with vaginal discharge. Wet prep remarkable for clue cells, will treat for bacterial vaginosis, no lower abdominal pain. Vital signs are stable. Will treat with Flagyl. She states that this did not work last time, but is willing to try again. I offered clindamycin as a second line treatment, but she is afraid that she'll be unable to afford it. Will treat with Flagyl for now, recommend OB/GYN follow-up.    Roxy Horseman, PA-C 05/30/14 0040

## 2014-05-29 NOTE — ED Notes (Signed)
Pt states she has been having vaginal discharge since she came here 2 months ago. Was given antibiotics and states they did not work. Alert and oriented.

## 2014-05-30 LAB — CBC WITH DIFFERENTIAL/PLATELET
BASOS ABS: 0.1 10*3/uL (ref 0.0–0.1)
Basophils Relative: 1 % (ref 0–1)
EOS ABS: 0.3 10*3/uL (ref 0.0–0.7)
Eosinophils Relative: 4 % (ref 0–5)
HCT: 30.8 % — ABNORMAL LOW (ref 36.0–46.0)
HEMOGLOBIN: 9.2 g/dL — AB (ref 12.0–15.0)
Lymphocytes Relative: 48 % — ABNORMAL HIGH (ref 12–46)
Lymphs Abs: 4.1 10*3/uL — ABNORMAL HIGH (ref 0.7–4.0)
MCH: 20.9 pg — ABNORMAL LOW (ref 26.0–34.0)
MCHC: 29.9 g/dL — AB (ref 30.0–36.0)
MCV: 70 fL — ABNORMAL LOW (ref 78.0–100.0)
Monocytes Absolute: 0.8 10*3/uL (ref 0.1–1.0)
Monocytes Relative: 10 % (ref 3–12)
NEUTROS PCT: 37 % — AB (ref 43–77)
Neutro Abs: 3.1 10*3/uL (ref 1.7–7.7)
Platelets: 275 10*3/uL (ref 150–400)
RBC: 4.4 MIL/uL (ref 3.87–5.11)
RDW: 21.6 % — ABNORMAL HIGH (ref 11.5–15.5)
WBC: 8.4 10*3/uL (ref 4.0–10.5)

## 2014-05-30 LAB — GC/CHLAMYDIA PROBE AMP
CT Probe RNA: NEGATIVE
GC Probe RNA: NEGATIVE

## 2014-05-30 MED ORDER — METRONIDAZOLE 500 MG PO TABS: 500.0000 mg | ORAL_TABLET | Freq: Two times a day (BID) | ORAL | Status: DC

## 2014-05-30 NOTE — Discharge Instructions (Signed)
Bacterial Vaginosis Bacterial vaginosis is a vaginal infection that occurs when the normal balance of bacteria in the vagina is disrupted. It results from an overgrowth of certain bacteria. This is the most common vaginal infection in women of childbearing age. Treatment is important to prevent complications, especially in pregnant women, as it can cause a premature delivery. CAUSES  Bacterial vaginosis is caused by an increase in harmful bacteria that are normally present in smaller amounts in the vagina. Several different kinds of bacteria can cause bacterial vaginosis. However, the reason that the condition develops is not fully understood. RISK FACTORS Certain activities or behaviors can put you at an increased risk of developing bacterial vaginosis, including:  Having a new sex partner or multiple sex partners.  Douching.  Using an intrauterine device (IUD) for contraception. Women do not get bacterial vaginosis from toilet seats, bedding, swimming pools, or contact with objects around them. SIGNS AND SYMPTOMS  Some women with bacterial vaginosis have no signs or symptoms. Common symptoms include:  Grey vaginal discharge.  A fishlike odor with discharge, especially after sexual intercourse.  Itching or burning of the vagina and vulva.  Burning or pain with urination. DIAGNOSIS  Your health care provider will take a medical history and examine the vagina for signs of bacterial vaginosis. A sample of vaginal fluid may be taken. Your health care provider will look at this sample under a microscope to check for bacteria and abnormal cells. A vaginal pH test may also be done.  TREATMENT  Bacterial vaginosis may be treated with antibiotic medicines. These may be given in the form of a pill or a vaginal cream. A second round of antibiotics may be prescribed if the condition comes back after treatment.  HOME CARE INSTRUCTIONS   Only take over-the-counter or prescription medicines as  directed by your health care provider.  If antibiotic medicine was prescribed, take it as directed. Make sure you finish it even if you start to feel better.  Do not have sex until treatment is completed.  Tell all sexual partners that you have a vaginal infection. They should see their health care provider and be treated if they have problems, such as a mild rash or itching.  Practice safe sex by using condoms and only having one sex partner. SEEK MEDICAL CARE IF:   Your symptoms are not improving after 3 days of treatment.  You have increased discharge or pain.  You have a fever. MAKE SURE YOU:   Understand these instructions.  Will watch your condition.  Will get help right away if you are not doing well or get worse. FOR MORE INFORMATION  Centers for Disease Control and Prevention, Division of STD Prevention: www.cdc.gov/std American Sexual Health Association (ASHA): www.ashastd.org  Document Released: 07/21/2005 Document Revised: 05/11/2013 Document Reviewed: 03/02/2013 [employer] Patient Information 2015 [employer]. This information is not intended to replace advice given to you by your health care provider. Make sure you discuss any questions you have with your health care provider.  

## 2014-05-31 NOTE — ED Provider Notes (Signed)
Medical screening examination/treatment/procedure(s) were performed by non-physician practitioner and as supervising physician I was immediately available for consultation/collaboration.   EKG Interpretation None       Emila Steinhauser, MD 05/31/14 1117 

## 2014-06-05 ENCOUNTER — Encounter (HOSPITAL_COMMUNITY): Payer: Self-pay | Admitting: Emergency Medicine

## 2014-12-20 ENCOUNTER — Ambulatory Visit (INDEPENDENT_AMBULATORY_CARE_PROVIDER_SITE_OTHER): Admitting: Certified Nurse Midwife

## 2014-12-20 ENCOUNTER — Encounter: Payer: Self-pay | Admitting: Certified Nurse Midwife

## 2014-12-20 VITALS — BP 111/67 | HR 76 | Temp 98.7°F | Ht 61.5 in | Wt 130.0 lb

## 2014-12-20 DIAGNOSIS — Z01411 Encounter for gynecological examination (general) (routine) with abnormal findings: Secondary | ICD-10-CM | POA: Diagnosis not present

## 2014-12-20 DIAGNOSIS — K5901 Slow transit constipation: Secondary | ICD-10-CM | POA: Diagnosis not present

## 2014-12-20 DIAGNOSIS — Z0000 Encounter for general adult medical examination without abnormal findings: Secondary | ICD-10-CM | POA: Diagnosis not present

## 2014-12-20 DIAGNOSIS — J302 Other seasonal allergic rhinitis: Secondary | ICD-10-CM | POA: Diagnosis not present

## 2014-12-20 DIAGNOSIS — K640 First degree hemorrhoids: Secondary | ICD-10-CM

## 2014-12-20 DIAGNOSIS — B3731 Acute candidiasis of vulva and vagina: Secondary | ICD-10-CM

## 2014-12-20 DIAGNOSIS — B373 Candidiasis of vulva and vagina: Secondary | ICD-10-CM

## 2014-12-20 DIAGNOSIS — Z01419 Encounter for gynecological examination (general) (routine) without abnormal findings: Secondary | ICD-10-CM

## 2014-12-20 LAB — CBC
HCT: 31.9 % — ABNORMAL LOW (ref 36.0–46.0)
Hemoglobin: 9.8 g/dL — ABNORMAL LOW (ref 12.0–15.0)
MCH: 22.5 pg — ABNORMAL LOW (ref 26.0–34.0)
MCHC: 30.7 g/dL (ref 30.0–36.0)
MCV: 73.3 fL — ABNORMAL LOW (ref 78.0–100.0)
MPV: 9.3 fL (ref 8.6–12.4)
PLATELETS: 360 10*3/uL (ref 150–400)
RBC: 4.35 MIL/uL (ref 3.87–5.11)
RDW: 16.7 % — ABNORMAL HIGH (ref 11.5–15.5)
WBC: 6.5 10*3/uL (ref 4.0–10.5)

## 2014-12-20 LAB — TSH: TSH: 1.658 u[IU]/mL (ref 0.350–4.500)

## 2014-12-20 MED ORDER — LORATADINE-PSEUDOEPHEDRINE ER 10-240 MG PO TB24: 1.0000 | ORAL_TABLET | Freq: Every day | ORAL | Status: DC

## 2014-12-20 MED ORDER — HYDROCORTISONE ACETATE 25 MG RE SUPP: 25.0000 mg | Freq: Two times a day (BID) | RECTAL | Status: AC

## 2014-12-20 MED ORDER — FLUCONAZOLE 100 MG PO TABS: 100.0000 mg | ORAL_TABLET | Freq: Every day | ORAL | Status: DC

## 2014-12-20 MED ORDER — FLUTICASONE PROPIONATE 50 MCG/ACT NA SUSP: 2.0000 | Freq: Every day | NASAL | Status: DC

## 2014-12-20 MED ORDER — SENNOSIDES-DOCUSATE SODIUM 8.6-50 MG PO TABS: 2.0000 | ORAL_TABLET | Freq: Every day | ORAL | Status: AC | PRN

## 2014-12-20 NOTE — Patient Instructions (Signed)
Allergies Allergies may happen from anything your body is sensitive to. This may be food, medicines, pollens, chemicals, and nearly anything around you in everyday life that produces allergens. An allergen is anything that causes an allergy producing substance. Heredity is often a factor in causing these problems. This means you may have some of the same allergies as your parents. Food allergies happen in all age groups. Food allergies are some of the most severe and life threatening. Some common food allergies are cow's milk, seafood, eggs, nuts, wheat, and soybeans. SYMPTOMS   Swelling around the mouth.  An itchy red rash or hives.  Vomiting or diarrhea.  Difficulty breathing. SEVERE ALLERGIC REACTIONS ARE LIFE-THREATENING. This reaction is called anaphylaxis. It can cause the mouth and throat to swell and cause difficulty with breathing and swallowing. In severe reactions only a trace amount of food (for example, peanut oil in a salad) may cause death within seconds. Seasonal allergies occur in all age groups. These are seasonal because they usually occur during the same season every year. They may be a reaction to molds, grass pollens, or tree pollens. Other causes of problems are house dust mite allergens, pet dander, and mold spores. The symptoms often consist of nasal congestion, a runny itchy nose associated with sneezing, and tearing itchy eyes. There is often an associated itching of the mouth and ears. The problems happen when you come in contact with pollens and other allergens. Allergens are the particles in the air that the body reacts to with an allergic reaction. This causes you to release allergic antibodies. Through a chain of events, these eventually cause you to release histamine into the blood stream. Although it is meant to be protective to the body, it is this release that causes your discomfort. This is why you were given anti-histamines to feel better. If you are unable to  pinpoint the offending allergen, it may be determined by skin or blood testing. Allergies cannot be cured but can be controlled with medicine. Hay fever is a collection of all or some of the seasonal allergy problems. It may often be treated with simple over-the-counter medicine such as diphenhydramine. Take medicine as directed. Do not drink alcohol or drive while taking this medicine. Check with your caregiver or package insert for child dosages. If these medicines are not effective, there are many new medicines your caregiver can prescribe. Stronger medicine such as nasal spray, eye drops, and corticosteroids may be used if the first things you try do not work well. Other treatments such as immunotherapy or desensitizing injections can be used if all else fails. Follow up with your caregiver if problems continue. These seasonal allergies are usually not life threatening. They are generally more of a nuisance that can often be handled using medicine. HOME CARE INSTRUCTIONS   If unsure what causes a reaction, keep a diary of foods eaten and symptoms that follow. Avoid foods that cause reactions.  If hives or rash are present:  Take medicine as directed.  You may use an over-the-counter antihistamine (diphenhydramine) for hives and itching as needed.  Apply cold compresses (cloths) to the skin or take baths in cool water. Avoid hot baths or showers. Heat will make a rash and itching worse.  If you are severely allergic:  Following a treatment for a severe reaction, hospitalization is often required for closer follow-up.  Wear a medic-alert bracelet or necklace stating the allergy.  You and your family must learn how to give adrenaline or use   an anaphylaxis kit.  If you have had a severe reaction, always carry your anaphylaxis kit or EpiPen with you. Use this medicine as directed by your caregiver if a severe reaction is occurring. Failure to do so could have a fatal outcome. SEEK MEDICAL  CARE IF:  You suspect a food allergy. Symptoms generally happen within 30 minutes of eating a food.  Your symptoms have not gone away within 2 days or are getting worse.  You develop new symptoms.  You want to retest yourself or your child with a food or drink you think causes an allergic reaction. Never do this if an anaphylactic reaction to that food or drink has happened before. Only do this under the care of a caregiver. SEEK IMMEDIATE MEDICAL CARE IF:   You have difficulty breathing, are wheezing, or have a tight feeling in your chest or throat.  You have a swollen mouth, or you have hives, swelling, or itching all over your body.  You have had a severe reaction that has responded to your anaphylaxis kit or an EpiPen. These reactions may return when the medicine has worn off. These reactions should be considered life threatening. MAKE SURE YOU:   Understand these instructions.  Will watch your condition.  Will get help right away if you are not doing well or get worse. Document Released: 10/14/2002 Document Revised: 11/15/2012 Document Reviewed: 03/20/2008 [facility] Patient Information 2015 [place], [place]. This information is not intended to replace advice given to you by your health care provider. Make sure you discuss any questions you have with your health care provider. Candidal Vulvovaginitis Candidal vulvovaginitis is an infection of the vagina and vulva. The vulva is the skin around the opening of the vagina. This may cause itching and discomfort in and around the vagina.  HOME CARE  Only take medicine as told by your doctor.  Do not have sex (intercourse) until the infection is healed or as told by your doctor.  Practice safe sex.  Tell your sex partner about your infection.  Do not douche or use tampons.  Wear cotton underwear. Do not wear tight pants or panty hose.  Eat yogurt. This may help treat and prevent yeast infections. GET HELP RIGHT AWAY IF:   You  have a fever.  Your problems get worse during treatment or do not get better in 3 days.  You have discomfort, irritation, or itching in your vagina or vulva area.  You have pain after sex.  You start to get belly (abdominal) pain. MAKE SURE YOU:  Understand these instructions.  Will watch your condition.  Will get help right away if you are not doing well or get worse. Document Released: 10/17/2008 Document Revised: 07/26/2013 Document Reviewed: 10/17/2008 [facility] Patient Information 2015 [place], [place]. This information is not intended to replace advice given to you by your health care provider. Make sure you discuss any questions you have with your health care provider.

## 2014-12-20 NOTE — Progress Notes (Signed)
Patient ID: Melanie GuanFlorence M Rayborn, female   DOB: [birth year], 24 y.o.   MRN: [id number]    Subjective:      Melanie Calderon is a 24 y.o. female here for a routine exam.  Current complaints: some itching occasionally, vaginal area with denies bleeding and has something that does not feel right, with occasionally burning.  Needs PCP.  Has lots of nasal congestion combined with chest congestion.  Is having trouble hearing.  Had allergies as a child.    Mom has had colon cancer at age 554, she also had HTN & DM.  Desires STD screening.    Personal health questionnaire:  Is patient Ashkenazi Jewish, have a family history of breast and/or ovarian cancer: no Is there a family history of uterine cancer diagnosed at age < 2150, gastrointestinal cancer, urinary tract cancer, family member who is a Personnel officerLynch syndrome-associated carrier: yes Is the patient overweight and hypertensive, family history of diabetes, personal history of gestational diabetes, preeclampsia or PCOS: no Is patient over 7955, have PCOS,  family history of premature CHD under age 25, diabetes, smoke, have hypertension or peripheral artery disease:  yes At any time, has a partner hit, kicked or otherwise hurt or frightened you?: no Over the past 2 weeks, have you felt down, depressed or hopeless?: no Over the past 2 weeks, have you felt little interest or pleasure in doing things?:no   Gynecologic History Patient's last menstrual period was 12/17/2014. Contraception: none Last Pap: unknown. Results were: normal according to the patient Last mammogram: N/A.   Obstetric History OB History  Gravida Para Term Preterm AB SAB TAB Ectopic Multiple Living  3 1            # Outcome Date GA Lbr Len/2nd Weight Sex Delivery Anes PTL Lv  3 Para           2 Gravida              Comments: System Generated. Please review and update pregnancy details.  1 Gravida             Has a 25 yr old & 25 year old.   Past Medical History  Diagnosis Date  .  Anemia   . Scoliosis   . Anemia     Past Surgical History  Procedure Laterality Date  . Caesa    . Cesarean section       Current outpatient prescriptions:  Marland Kitchen.  Multiple Vitamin (MULTIVITAMIN WITH MINERALS) TABS tablet, Take 1 tablet by mouth daily., Disp: , Rfl:  .  CRANBERRY PO, Take 1 tablet by mouth 3 (three) times daily., Disp: , Rfl:  .  ferrous sulfate 325 (65 FE) MG tablet, Take 325 mg by mouth daily with breakfast., Disp: , Rfl:  .  fluconazole (DIFLUCAN) 100 MG tablet, Take 1 tablet (100 mg total) by mouth daily., Disp: 3 tablet, Rfl: 1 .  fluticasone (FLONASE) 50 MCG/ACT nasal spray, Place 2 sprays into both nostrils daily., Disp: 16 g, Rfl: 2 .  hydrocortisone (ANUSOL-HC) 25 MG suppository, Place 1 suppository (25 mg total) rectally every 12 (twelve) hours., Disp: 12 suppository, Rfl: 1 .  loratadine-pseudoephedrine (CLARITIN-D 24 HOUR) 10-240 MG per 24 hr tablet, Take 1 tablet by mouth daily., Disp: 30 tablet, Rfl: 2 .  metroNIDAZOLE (FLAGYL) 500 MG tablet, Take 1 tablet (500 mg total) by mouth 2 (two) times daily. (Patient not taking: Reported on 12/20/2014), Disp: 14 tablet, Rfl: 0 .  senna-docusate (SENOKOT-S) 8.6-50 MG per  tablet, Take 2 tablets by mouth daily as needed for mild constipation., Disp: 30 tablet, Rfl: 1 No Known Allergies  History  Substance Use Topics  . Smoking status: Former Games developer  . Smokeless tobacco: Former Neurosurgeon    Quit date: 03/12/2011  . Alcohol Use: No     Comment: occassional wine    Family History  Problem Relation Age of Onset  . Cancer Mother   . Diabetes Mother   . Hypertension Mother       Review of Systems  Constitutional: negative for fatigue and weight loss Respiratory: negative for cough and wheezing Cardiovascular: negative for chest pain, fatigue and palpitations Gastrointestinal: negative for abdominal pain and change in bowel habits Musculoskeletal:negative for myalgias Neurological: negative for gait problems and  tremors Behavioral/Psych: negative for abusive relationship, depression Endocrine: negative for temperature intolerance   Genitourinary:negative for abnormal menstrual periods, genital lesions, hot flashes, sexual problems. + vaginal discharge Integument/breast: negative for breast lump, breast tenderness, nipple discharge and skin lesion(s)    Objective:       BP 111/67 mmHg  Pulse 76  Temp(Src) 98.7 F (37.1 C)  Ht 5' 1.5" (1.562 m)  Wt 58.968 kg (130 lb)  BMI 24.17 kg/m2  LMP 12/17/2014 General:   alert  HEENT  Sinus no tenderness to palpation, nares erythremic +rhinorhhea.  Bilateral tympanic membranes occuleded by cerumen.    Skin:   no rash or abnormalities  Lungs:   clear to auscultation bilaterally  Heart:   regular rate and rhythm, S1, S2 normal, no murmur, click, rub or gallop  Breasts:   normal without suspicious masses, skin or nipple changes or axillary nodes  Abdomen:  normal findings: no organomegaly, soft, non-tender and no hernia  Pelvis:  External genitalia: normal general appearance Urinary system: urethral meatus normal and bladder without fullness, nontender Vaginal: normal without tenderness, induration or masses Cervix: normal appearance Adnexa: normal bimanual exam Uterus: anteverted and non-tender, normal size Rectum: small hemorrhoid noted   Lab Review Urine pregnancy test Labs reviewed yes Radiologic studies reviewed no  50% of 30 min visit spent on counseling and coordination of care.   Assessment:    Healthy female exam.   Allergic rhinitis Hemorrhoids Cerumen impaction bilaterally Vulvovaginal candidiasis   Plan:    Education reviewed: depression evaluation, low fat, low cholesterol diet, safe sex/STD prevention, self breast exams, skin cancer screening and weight bearing exercise. Contraception: none and encouraged f/u for birth control if her situation changes. Follow up in: 1 year.  And PRN for birth control.    Meds ordered this  encounter  Medications  . fluconazole (DIFLUCAN) 100 MG tablet    Sig: Take 1 tablet (100 mg total) by mouth daily.    Dispense:  3 tablet    Refill:  1  . hydrocortisone (ANUSOL-HC) 25 MG suppository    Sig: Place 1 suppository (25 mg total) rectally every 12 (twelve) hours.    Dispense:  12 suppository    Refill:  1  . fluticasone (FLONASE) 50 MCG/ACT nasal spray    Sig: Place 2 sprays into both nostrils daily.    Dispense:  16 g    Refill:  2  . loratadine-pseudoephedrine (CLARITIN-D 24 HOUR) 10-240 MG per 24 hr tablet    Sig: Take 1 tablet by mouth daily.    Dispense:  30 tablet    Refill:  2  . senna-docusate (SENOKOT-S) 8.6-50 MG per tablet    Sig: Take 2 tablets by mouth daily as  needed for mild constipation.    Dispense:  30 tablet    Refill:  1   Orders Placed This Encounter  Procedures  . HIV antibody (with reflex)  . Hepatitis B surface antigen  . RPR  . Hepatitis C antibody  . TSH  . CBC  . Ambulatory referral to Internal Medicine    Referral Priority:  Routine    Referral Type:  Consultation    Referral Reason:  Specialty Services Required    Requested Specialty:  Internal Medicine    Number of Visits Requested:  1

## 2014-12-21 LAB — PAP IG W/ RFLX HPV ASCU

## 2014-12-21 LAB — RPR

## 2014-12-21 LAB — HIV ANTIBODY (ROUTINE TESTING W REFLEX): HIV 1&2 Ab, 4th Generation: NONREACTIVE

## 2014-12-21 LAB — HEPATITIS B SURFACE ANTIGEN: Hepatitis B Surface Ag: NEGATIVE

## 2014-12-21 LAB — HEPATITIS C ANTIBODY: HCV Ab: NEGATIVE

## 2014-12-24 LAB — SURESWAB, VAGINOSIS/VAGINITIS PLUS
Atopobium vaginae: NOT DETECTED Log (cells/mL)
C. GLABRATA, DNA: DETECTED — AB
C. TRACHOMATIS RNA, TMA: NOT DETECTED
C. TROPICALIS, DNA: NOT DETECTED
C. albicans, DNA: DETECTED — AB
C. parapsilosis, DNA: NOT DETECTED
Gardnerella vaginalis: 5.3 Log (cells/mL)
LACTOBACILLUS SPECIES: 7.8 Log (cells/mL)
MEGASPHAERA SPECIES: NOT DETECTED Log (cells/mL)
N. gonorrhoeae RNA, TMA: NOT DETECTED
T. VAGINALIS RNA, QL TMA: NOT DETECTED

## 2014-12-26 ENCOUNTER — Other Ambulatory Visit: Payer: Self-pay | Admitting: *Deleted

## 2014-12-26 DIAGNOSIS — D649 Anemia, unspecified: Secondary | ICD-10-CM

## 2014-12-26 MED ORDER — FUSION PLUS PO CAPS: 1.0000 | ORAL_CAPSULE | Freq: Every day | ORAL | Status: DC

## 2015-02-15 ENCOUNTER — Telehealth: Payer: Self-pay | Admitting: *Deleted

## 2015-02-15 ENCOUNTER — Emergency Department (HOSPITAL_COMMUNITY)
Admission: EM | Admit: 2015-02-15 | Discharge: 2015-02-16 | Disposition: A | Discharge status: Home / Self Care | Payer: Medicaid Other | Attending: Emergency Medicine | Admitting: Emergency Medicine

## 2015-02-15 ENCOUNTER — Encounter (HOSPITAL_COMMUNITY): Payer: Self-pay | Admitting: Emergency Medicine

## 2015-02-15 DIAGNOSIS — K029 Dental caries, unspecified: Secondary | ICD-10-CM | POA: Diagnosis not present

## 2015-02-15 DIAGNOSIS — K002 Abnormalities of size and form of teeth: Secondary | ICD-10-CM | POA: Diagnosis not present

## 2015-02-15 DIAGNOSIS — M419 Scoliosis, unspecified: Secondary | ICD-10-CM | POA: Diagnosis not present

## 2015-02-15 DIAGNOSIS — Z87891 Personal history of nicotine dependence: Secondary | ICD-10-CM | POA: Insufficient documentation

## 2015-02-15 DIAGNOSIS — R51 Headache: Secondary | ICD-10-CM | POA: Insufficient documentation

## 2015-02-15 DIAGNOSIS — K088 Other specified disorders of teeth and supporting structures: Secondary | ICD-10-CM | POA: Diagnosis not present

## 2015-02-15 DIAGNOSIS — B373 Candidiasis of vulva and vagina: Secondary | ICD-10-CM

## 2015-02-15 DIAGNOSIS — D649 Anemia, unspecified: Secondary | ICD-10-CM | POA: Insufficient documentation

## 2015-02-15 DIAGNOSIS — Z79899 Other long term (current) drug therapy: Secondary | ICD-10-CM | POA: Diagnosis not present

## 2015-02-15 DIAGNOSIS — R110 Nausea: Secondary | ICD-10-CM | POA: Diagnosis not present

## 2015-02-15 DIAGNOSIS — B3731 Acute candidiasis of vulva and vagina: Secondary | ICD-10-CM

## 2015-02-15 DIAGNOSIS — K0889 Other specified disorders of teeth and supporting structures: Secondary | ICD-10-CM

## 2015-02-15 MED ORDER — TERCONAZOLE 0.4 % VA CREA: 1.0000 | TOPICAL_CREAM | Freq: Every day | VAGINAL | Status: DC

## 2015-02-15 MED ORDER — PENICILLIN V POTASSIUM 500 MG PO TABS: 500.0000 mg | ORAL_TABLET | Freq: Four times a day (QID) | ORAL | Status: DC

## 2015-02-15 MED ORDER — PENICILLIN V POTASSIUM 500 MG PO TABS
500.0000 mg | ORAL_TABLET | Freq: Once | ORAL | Status: AC
  Administered 2015-02-16: 500 mg via ORAL
  Filled 2015-02-15: qty 1

## 2015-02-15 MED ORDER — NAPROXEN 500 MG PO TABS
500.0000 mg | ORAL_TABLET | Freq: Once | ORAL | Status: AC
  Administered 2015-02-16: 500 mg via ORAL
  Filled 2015-02-15: qty 1

## 2015-02-15 MED ORDER — MELOXICAM 15 MG PO TABS: 15.0000 mg | ORAL_TABLET | Freq: Every day | ORAL | Status: DC

## 2015-02-15 NOTE — Telephone Encounter (Signed)
Patient is requesting a RX for yeast. The pill did not work- patient would like to try the cream.

## 2015-02-15 NOTE — Discharge Instructions (Signed)
Take veetid as directed until gone. Take mobic as needed for pain. Refer to attached documents for more information.

## 2015-02-15 NOTE — ED Provider Notes (Signed)
CSN: [id number]     Arrival date & time 02/15/15  2114 History  This chart was scribed for non-physician provider Emilia BeckKaitlyn Eulia Hatcher, PA-C, working with Eber HongBrian Miller, MD by Phillis HaggisGabriella Gaje, ED Scribe. This patient was seen in room WTR7/WTR7 and patient care was started at 10:44 PM.     Chief Complaint  Patient presents with  . Dental Pain   The history is provided by the patient. No language interpreter was used.  HPI Comments: Melanie Calderon is a 24 y.o. female who presents to the Emergency Department complaining of gradually worsening right lower dental pain onset 2 months ago. Pt reports that there is a hole in her tooth. Reports associated headache and nausea; states that the pain can get so bad to the point where she has a migraine. Has an appt August 2nd to take the tooth out, but would like pain medication to manage the pain until her appointment. Reports using ibuprofen and Orajel to no relief. She denies fevers, any other symptoms, or significant medical problems.   Past Medical History  Diagnosis Date  . Anemia   . Scoliosis   . Anemia    Past Surgical History  Procedure Laterality Date  . Caesa    . Cesarean section     Family History  Problem Relation Age of Onset  . Cancer Mother   . Diabetes Mother   . Hypertension Mother    History  Substance Use Topics  . Smoking status: Former Games developermoker  . Smokeless tobacco: Former NeurosurgeonUser    Quit date: 03/12/2011  . Alcohol Use: No     Comment: occassional wine   OB History    Gravida Para Term Preterm AB TAB SAB Ectopic Multiple Living   3 1             Review of Systems  Constitutional: Negative for fever.  HENT: Positive for dental problem.   Gastrointestinal: Positive for nausea.  Neurological: Positive for headaches.  All other systems reviewed and are negative.  Allergies  Review of patient's allergies indicates no known allergies.  Home Medications   Prior to Admission medications   Medication Sig Start Date  End Date Taking? Authorizing Provider  CRANBERRY PO Take 1 tablet by mouth 3 (three) times daily.    Historical Provider, MD  ferrous sulfate 325 (65 FE) MG tablet Take 325 mg by mouth daily with breakfast.    Historical Provider, MD  fluconazole (DIFLUCAN) 100 MG tablet Take 1 tablet (100 mg total) by mouth daily. 12/20/14   Rachelle A Denney, CNM  fluticasone (FLONASE) 50 MCG/ACT nasal spray Place 2 sprays into both nostrils daily. 12/20/14 12/20/15  Rachelle A Denney, CNM  hydrocortisone (ANUSOL-HC) 25 MG suppository Place 1 suppository (25 mg total) rectally every 12 (twelve) hours. 12/20/14 12/20/15  Rachelle A Denney, CNM  Iron-FA-B Cmp-C-Biot-Probiotic (FUSION PLUS) CAPS Take 1 capsule by mouth daily. 12/26/14   Rachelle A Denney, CNM  loratadine-pseudoephedrine (CLARITIN-D 24 HOUR) 10-240 MG per 24 hr tablet Take 1 tablet by mouth daily. 12/20/14 12/20/15  Rachelle A Denney, CNM  metroNIDAZOLE (FLAGYL) 500 MG tablet Take 1 tablet (500 mg total) by mouth 2 (two) times daily. Patient not taking: Reported on 12/20/2014 05/30/14   Roxy Horsemanobert Browning, PA-C  Multiple Vitamin (MULTIVITAMIN WITH MINERALS) TABS tablet Take 1 tablet by mouth daily.    Historical Provider, MD  senna-docusate (SENOKOT-S) 8.6-50 MG per tablet Take 2 tablets by mouth daily as needed for mild constipation. 12/20/14 12/20/15  Rachelle  A Denney, CNM  terconazole (TERAZOL 7) 0.4 % vaginal cream Place 1 applicator vaginally at bedtime. 02/15/15   Rachelle A Denney, CNM   BP 108/69 mmHg  Pulse 76  Temp(Src) 99.2 F (37.3 C) (Oral)  Resp 18  Ht  (1.549 m)  Wt 127 lb (57.607 kg)  BMI 24.01 kg/m2  SpO2 100%  LMP 02/01/2015  Physical Exam  Constitutional: She is oriented to person, place, and time. She appears well-developed and well-nourished.  HENT:  Head: Normocephalic and atraumatic.  Poor dentition. Multiple metal fillings in molars. Right lower second molar cavity with tenderness to percussion. No abscess noted.   Eyes:  EOM are normal.  Neck: Normal range of motion. Neck supple.  Cardiovascular: Normal rate.   Pulmonary/Chest: Effort normal.  Abdominal: Soft. She exhibits no distension. There is no tenderness. There is no rebound.  Musculoskeletal: Normal range of motion.  Neurological: She is alert and oriented to person, place, and time.  Skin: Skin is warm and dry.  Psychiatric: She has a normal mood and affect. Her behavior is normal.  Nursing note and vitals reviewed.   ED Course  Procedures (including critical care time) DIAGNOSTIC STUDIES: Oxygen Saturation is 100% on RA, normal by my interpretation.    COORDINATION OF CARE: 11:39 PM-Discussed treatment plan which includes pain medication, anti-biotics, and referral to on call dentist with pt at bedside and pt agreed to plan.   Labs Review Labs Reviewed - No data to display  Imaging Review No results found.   EKG Interpretation None      MDM   Final diagnoses:  Pain, dental    11:44 PM Patient has no signs of ludwigs angina. Patient will have veetid and mobic for symptoms. Patient will have recommended follow up with Dr. Russella Dar. Patient instructed to return with worsening or concerning symptoms. Vitals stable and patient afebrile.   I personally performed the services described in this documentation, which was scribed in my presence. The recorded information has been reviewed and is accurate.    Emilia Beck, PA-C 02/15/15 2346  Eber Hong, MD 02/15/15 2352

## 2015-02-15 NOTE — ED Notes (Signed)
Patient complains lower right dental pain.  Patient went for a cleaning in May and pain has increased daily.  She has some nausea with the pain.

## 2015-02-27 ENCOUNTER — Institutional Professional Consult (permissible substitution): Payer: Medicaid Other | Admitting: Certified Nurse Midwife

## 2015-02-27 ENCOUNTER — Ambulatory Visit (INDEPENDENT_AMBULATORY_CARE_PROVIDER_SITE_OTHER): Payer: Medicaid Other | Admitting: Certified Nurse Midwife

## 2015-02-27 ENCOUNTER — Encounter: Payer: Self-pay | Admitting: Certified Nurse Midwife

## 2015-02-27 ENCOUNTER — Telehealth: Payer: Self-pay | Admitting: *Deleted

## 2015-02-27 VITALS — BP 119/75 | HR 70 | Wt 129.0 lb

## 2015-02-27 DIAGNOSIS — Z30011 Encounter for initial prescription of contraceptive pills: Secondary | ICD-10-CM

## 2015-02-27 MED ORDER — LEVONORGEST-ETH EST & ETH EST 42-21-21-7 DAYS PO TABS: 1.0000 | ORAL_TABLET | Freq: Every day | ORAL | Status: DC

## 2015-02-27 NOTE — Progress Notes (Signed)
Patient ID: Melanie Calderon, female   DOB: 13-May-1991, 24 y.o.   MRN: [id number]  Subjective:    Melanie Calderon is a 24 y.o. female who presents for contraception counseling. The patient has no complaints today. The patient is not currently sexually active. Pertinent past medical history: none.  Since annual exam in May has been having irregular periods with heavy bleeding and dysmenorrhea, denies any clots, menses lasting 5-7 days.  Desires to have periods regulated and shorter duration.    The information documented in the HPI was reviewed and verified.  Menstrual History: OB History    Gravida Para Term Preterm AB TAB SAB Ectopic Multiple Living   3 1              Menarche age: did not ask  Patient's last menstrual period was 02/23/2015.   There are no active problems to display for this patient.  Past Medical History  Diagnosis Date  . Anemia   . Scoliosis   . Anemia     Past Surgical History  Procedure Laterality Date  . Caesa    . Cesarean section       Current outpatient prescriptions:  .  ferrous sulfate 325 (65 FE) MG tablet, Take 325 mg by mouth daily with breakfast., Disp: , Rfl:  .  hydrocortisone (ANUSOL-HC) 25 MG suppository, Place 1 suppository (25 mg total) rectally every 12 (twelve) hours., Disp: 12 suppository, Rfl: 1 .  ibuprofen (ADVIL,MOTRIN) 200 MG tablet, Take 200 mg by mouth every 6 (six) hours as needed for headache, mild pain or moderate pain., Disp: , Rfl:  .  loratadine-pseudoephedrine (CLARITIN-D 24 HOUR) 10-240 MG per 24 hr tablet, Take 1 tablet by mouth daily., Disp: 30 tablet, Rfl: 2 .  meloxicam (MOBIC) 15 MG tablet, Take 1 tablet (15 mg total) by mouth daily., Disp: 20 tablet, Rfl: 0 .  penicillin v potassium (VEETID) 500 MG tablet, Take 1 tablet (500 mg total) by mouth 4 (four) times daily., Disp: 40 tablet, Rfl: 0 .  fluconazole (DIFLUCAN) 100 MG tablet, Take 1 tablet (100 mg total) by mouth daily. (Patient not taking: Reported on  02/15/2015), Disp: 3 tablet, Rfl: 1 .  fluticasone (FLONASE) 50 MCG/ACT nasal spray, Place 2 sprays into both nostrils daily. (Patient taking differently: Place 2 sprays into both nostrils daily as needed for allergies. ), Disp: 16 g, Rfl: 2 .  Iron-FA-B Cmp-C-Biot-Probiotic (FUSION PLUS) CAPS, Take 1 capsule by mouth daily. (Patient not taking: Reported on 02/15/2015), Disp: 30 capsule, Rfl: 5 .  Levonorgest-Eth Est & Eth Est (QUARTETTE) 42-21-21-7 DAYS TABS, Take 1 tablet by mouth daily., Disp: 91 tablet, Rfl: 4 .  Multiple Vitamin (MULTIVITAMIN WITH MINERALS) TABS tablet, Take 1 tablet by mouth daily., Disp: , Rfl:  .  senna-docusate (SENOKOT-S) 8.6-50 MG per tablet, Take 2 tablets by mouth daily as needed for mild constipation. (Patient not taking: Reported on 02/15/2015), Disp: 30 tablet, Rfl: 1 .  terconazole (TERAZOL 7) 0.4 % vaginal cream, Place 1 applicator vaginally at bedtime. (Patient not taking: Reported on 02/15/2015), Disp: 45 g, Rfl: 0 No Known Allergies  History  Substance Use Topics  . Smoking status: Former Games developer  . Smokeless tobacco: Former Neurosurgeon    Quit date: 03/12/2011  . Alcohol Use: No     Comment: occassional wine    Family History  Problem Relation Age of Onset  . Cancer Mother   . Diabetes Mother   . Hypertension Mother  Review of Systems Constitutional: negative for weight loss Genitourinary:+ for abnormal menstrual periods and negative for vaginal discharge   Objective:   BP 119/75 mmHg  Pulse 70  Wt 129 lb (58.514 kg)  LMP 02/23/2015   General:   alert  Skin:   no rash or abnormalities  Lungs:   clear to auscultation bilaterally  Heart:   regular rate and rhythm, S1, S2 normal, no murmur, click, rub or gallop  Breasts:   deferred  Abdomen:  normal findings: no organomegaly, soft, non-tender and no hernia  Pelvis:  deferred   Lab Review Urine pregnancy test Labs reviewed yes Radiologic studies reviewed no  100% of 15 min visit spent on  counseling and coordination of care.   Assessment:    24 y.o., starting OCP (estrogen/progesterone), no contraindications.   Plan:    All questions answered.  Meds ordered this encounter  Medications  . Levonorgest-Eth Est & Eth Est (QUARTETTE) 42-21-21-7 DAYS TABS    Sig: Take 1 tablet by mouth daily.    Dispense:  91 tablet    Refill:  4   No orders of the defined types were placed in this encounter.    Follow up as needed.

## 2015-02-27 NOTE — Telephone Encounter (Signed)
Patient is having trouble filling her Quartette.  Call to pharmacy- when they try to run it through it is not covered. Solmon Ice is covered so we changed her to that and we will notified the drug rep to let her know.

## 2015-03-15 ENCOUNTER — Other Ambulatory Visit: Payer: Self-pay | Admitting: Certified Nurse Midwife

## 2015-04-11 ENCOUNTER — Telehealth: Payer: Self-pay | Admitting: *Deleted

## 2015-04-11 NOTE — Telephone Encounter (Signed)
Patient states that her allergy medication is not covered and she would like an alternative- also she is requesting a refill of the yeast cream.  9/7 11:42 Spoke with patient - told her she could get Claritin at [facility] for $4- but she wants [place] to send something different in- told her i would have Rachelle check the list to see if there is anything on it. She also wants a refill on her yeast medication. Will address with Rachelle also.

## 2015-04-12 ENCOUNTER — Other Ambulatory Visit: Payer: Self-pay | Admitting: Certified Nurse Midwife

## 2015-04-12 DIAGNOSIS — B3731 Acute candidiasis of vulva and vagina: Secondary | ICD-10-CM

## 2015-04-12 DIAGNOSIS — B373 Candidiasis of vulva and vagina: Secondary | ICD-10-CM

## 2015-04-12 MED ORDER — LORATADINE 10 MG PO TABS: 10.0000 mg | ORAL_TABLET | Freq: Every day | ORAL | Status: DC

## 2015-04-12 MED ORDER — FLUCONAZOLE 100 MG PO TABS: 100.0000 mg | ORAL_TABLET | Freq: Every day | ORAL | Status: DC

## 2015-04-12 MED ORDER — TERCONAZOLE 0.4 % VA CREA: 1.0000 | TOPICAL_CREAM | Freq: Every day | VAGINAL | Status: DC

## 2015-04-12 NOTE — Telephone Encounter (Signed)
Attempted to call patient mail box is full

## 2015-04-12 NOTE — Telephone Encounter (Signed)
Please tell her that I have refilled both the cream and Diflucan for the yeast infection.  I have also sent the Laratadine 10 mg tablets to the pharmacy that are on the $4 [facility] list.  Thank you Erskine Squibb.

## 2015-05-18 ENCOUNTER — Telehealth: Payer: Self-pay | Admitting: *Deleted

## 2015-05-18 NOTE — Telephone Encounter (Signed)
Patient states she is needing a referral for an allergist. Patient states she develops a rash around her mouth and under her chin after she is eating. Patient would like to find out what is causing it.

## 2015-05-18 NOTE — Telephone Encounter (Signed)
Patient contacted the office requesting a referral to the allergist.  Attempted to contact the patient and left message for patient to call the office.

## 2015-05-21 ENCOUNTER — Other Ambulatory Visit: Payer: Self-pay | Admitting: Certified Nurse Midwife

## 2015-05-21 DIAGNOSIS — Z889 Allergy status to unspecified drugs, medicaments and biological substances status: Secondary | ICD-10-CM

## 2015-05-21 NOTE — Telephone Encounter (Signed)
I have placed an order for referral for her allergies.  Please have her take Benadryl and document what she ate/took that day.  Thank you.

## 2015-05-21 NOTE — Telephone Encounter (Signed)
Patient notified

## 2015-05-22 ENCOUNTER — Telehealth: Payer: Self-pay

## 2015-05-22 NOTE — Telephone Encounter (Signed)
PATIENT HAS APPT AT [facility] - 11.2.16- AT 8:30AM - NO ALLERGY MEDICATIONS 3 DAYS PRIOR - LEFT PATIENT PHONE NUMBER AND ADDRESS FOR THIER OFFICE

## 2015-06-06 ENCOUNTER — Ambulatory Visit: Payer: Medicaid Other | Admitting: Allergy and Immunology

## 2015-06-19 ENCOUNTER — Telehealth: Payer: Self-pay | Admitting: *Deleted

## 2015-06-19 ENCOUNTER — Ambulatory Visit (INDEPENDENT_AMBULATORY_CARE_PROVIDER_SITE_OTHER): Payer: Medicaid Other | Admitting: Allergy and Immunology

## 2015-06-19 ENCOUNTER — Encounter: Payer: Self-pay | Admitting: Allergy and Immunology

## 2015-06-19 VITALS — BP 112/80 | HR 88 | Temp 98.2°F | Resp 22 | Ht 60.63 in | Wt 130.1 lb

## 2015-06-19 DIAGNOSIS — J309 Allergic rhinitis, unspecified: Secondary | ICD-10-CM

## 2015-06-19 DIAGNOSIS — Z800 Family history of malignant neoplasm of digestive organs: Secondary | ICD-10-CM | POA: Diagnosis not present

## 2015-06-19 DIAGNOSIS — H1010 Acute atopic conjunctivitis, unspecified eye: Secondary | ICD-10-CM | POA: Diagnosis not present

## 2015-06-19 DIAGNOSIS — L710 Perioral dermatitis: Secondary | ICD-10-CM | POA: Diagnosis not present

## 2015-06-19 MED ORDER — MUPIROCIN CALCIUM 2 % EX CREA: TOPICAL_CREAM | CUTANEOUS | Status: DC

## 2015-06-19 NOTE — Telephone Encounter (Signed)
Patient would like to get established with a good primary care provider- can we refer someone for her?  She would like someone close in this area.

## 2015-06-19 NOTE — Patient Instructions (Signed)
  1. Bactroban cream- apply to face two times a day  2. OTC Rhinocort - one spray each nostril 3-7 times per week. Takes days to work  3. If needed: Loratadine 10mg  one tablet one time per day.  4. Return in four weeks. Further evaluation?  5. Get a flu vaccine  6. Discuss with gastroenterologist about screening colonoscopy for family history

## 2015-06-19 NOTE — Progress Notes (Signed)
[facility]    NEW PATIENT NOTE  Referring Provider: Roe Coombs, CNM Primary Provider: Roe Coombs, CNM    Subjective:   Patient ID: Melanie Calderon is a 24 y.o. female with a chief complaint of Other and Nasal Congestion  and the following problems:  HPI Comments:  [name] presents this clinic in evaluation of rash. She has been having a perioral dermatitis for possibly 2 years that presents with red spots and some itchiness on occasion. She has noticed small pimples in this area. She has no associated systemic or constitutional symptoms. There is no obvious trigger except for the fact that her perioral region gets itchy when she washes her hair. She may have also been developing some problems with other areas of her skin on her face.. She occasionally notices really small red pimples.  [name] also has a history of allergies affecting her nose with sneezing and nose blowing and itchy throat and itchy nose occurring on a perennial basis with some degree of seasonal exacerbation during the spring and fall. There is no obvious trigger giving rise to these issues. She has no associated anosmia or perversions of taste. She takes Claritin which helped somewhat.  [name] also has a strong family history of colon cancer. Her mom was diagnosed with inoperable colon cancer at the age of 56 and passed away from this disease state at 11. To date, [name] does not have any issues involving her gastrointestinal tract.   Past Medical History  Diagnosis Date  . Anemia   . Scoliosis   . Anemia     Past Surgical History  Procedure Laterality Date  . Caesa    . Cesarean section      Outpatient Encounter Prescriptions as of 06/19/2015  Medication Sig  . fluticasone (FLONASE) 50 MCG/ACT nasal spray Place 2 sprays into both nostrils daily. (Patient taking differently: Place 2 sprays into both nostrils daily as needed for  allergies. )  . ibuprofen (ADVIL,MOTRIN) 200 MG tablet Take 200 mg by mouth every 6 (six) hours as needed for headache, mild pain or moderate pain.  . Iron-FA-B Cmp-C-Biot-Probiotic (FUSION PLUS) CAPS Take 1 capsule by mouth daily.  [name] Est & Eth Est (QUARTETTE) 42-21-21-7 DAYS TABS Take 1 tablet by mouth daily.  Marland Kitchen loratadine (CLARITIN) 10 MG tablet Take 1 tablet (10 mg total) by mouth daily.  . Multiple Vitamin (MULTIVITAMIN WITH MINERALS) TABS tablet Take 1 tablet by mouth daily.  . [facility] LORATADINE-D 24 HOUR 10-240 MG per 24 hr tablet TAKE 1 TABLET BY MOUTH DAILY. (Patient not taking: Reported on 06/19/2015)  . ferrous sulfate 325 (65 FE) MG tablet Take 325 mg by mouth daily with breakfast.  . fluconazole (DIFLUCAN) 100 MG tablet Take 1 tablet (100 mg total) by mouth daily. (Patient not taking: Reported on 06/19/2015)  . hydrocortisone (ANUSOL-HC) 25 MG suppository Place 1 suppository (25 mg total) rectally every 12 (twelve) hours. (Patient not taking: Reported on 06/19/2015)  . meloxicam (MOBIC) 15 MG tablet Take 1 tablet (15 mg total) by mouth daily. (Patient not taking: Reported on 06/19/2015)  . penicillin v potassium (VEETID) 500 MG tablet Take 1 tablet (500 mg total) by mouth 4 (four) times daily. (Patient not taking: Reported on 06/19/2015)  . senna-docusate (SENOKOT-S) 8.6-50 MG per tablet Take 2 tablets by mouth daily as needed for mild constipation. (Patient not taking: Reported on 02/15/2015)  . terconazole (TERAZOL 7) 0.4 % vaginal  cream Place 1 applicator vaginally at bedtime. (Patient not taking: Reported on 06/19/2015)   No facility-administered encounter medications on file as of 06/19/2015.    No orders of the defined types were placed in this encounter.    No Known Allergies  Review of Systems  Constitutional: Negative for fever and chills.  HENT: Positive for congestion and sneezing (See history of present illness). Negative for ear pain, facial swelling,  nosebleeds, postnasal drip, rhinorrhea, sinus pressure, sore throat, tinnitus, trouble swallowing and voice change.   Eyes: Negative for pain, discharge, redness and itching.  Respiratory: Negative for cough, choking, chest tightness, shortness of breath, wheezing and stridor.   Cardiovascular: Negative for chest pain and leg swelling.  Gastrointestinal: Positive for nausea. Negative for vomiting and abdominal pain.  Endocrine: Negative for cold intolerance and heat intolerance.  Genitourinary: Negative for difficulty urinating.  Musculoskeletal: Negative for myalgias and arthralgias.  Skin: Positive for rash (see history of present illness).  Allergic/Immunologic: Negative.   Neurological: Negative for dizziness.  Hematological: Negative for adenopathy.    Family History  Problem Relation Age of Onset  . Cancer Mother     colon cancer  . Diabetes Mother   . Hypertension Mother     Social History   Social History  . Marital Status: Single    Spouse Name: N/A  . Number of Children: N/A  . Years of Education: N/A   Occupational History  . Not on file.   Social History Main Topics  . Smoking status: Never Smoker   . Smokeless tobacco: Never Used  . Alcohol Use: 0.0 oz/week    0 Standard drinks or equivalent per week     Comment: occassional wine  . Drug Use: No  . Sexual Activity: No   Other Topics Concern  . Not on file   Social History Narrative    Environmental and Social history  Lives in a house with a dry environment, no animals located inside the household, no carpeting in the bedroom, no plastic on the bed or pillows, and no smokers located inside the household. She is a childcare provider.   Objective:   Filed Vitals:   06/19/15 1014  BP: 112/80  Pulse: 88  Temp: 98.2 F (36.8 C)  Resp: 22   Height: 5' 0.63" (154 cm) Weight: 130 lb 1.1 oz (59 kg)  Physical Exam  Constitutional: She appears well-developed and well-nourished. No distress.  HENT:   Head: Normocephalic and atraumatic. Head is without right periorbital erythema and without left periorbital erythema.  Right Ear: Tympanic membrane, external ear and ear canal normal. No drainage or tenderness. No foreign bodies. Tympanic membrane is not injected, not scarred, not perforated, not erythematous, not retracted and not bulging. No middle ear effusion.  Left Ear: Tympanic membrane, external ear and ear canal normal. No drainage or tenderness. No foreign bodies. Tympanic membrane is not injected, not scarred, not perforated, not erythematous, not retracted and not bulging.  No middle ear effusion.  Nose: Nose normal. No mucosal edema, rhinorrhea, nose lacerations or sinus tenderness.  No foreign bodies.  Mouth/Throat: Oropharynx is clear and moist. No oropharyngeal exudate, posterior oropharyngeal edema, posterior oropharyngeal erythema or tonsillar abscesses.  Eyes: Lids are normal. Right eye exhibits no chemosis, no discharge and no exudate. No foreign body present in the right eye. Left eye exhibits no chemosis, no discharge and no exudate. No foreign body present in the left eye. Right conjunctiva is not injected. Left conjunctiva is not injected.  Neck: Neck supple. No tracheal tenderness present. No tracheal deviation and no edema present. No thyroid mass and no thyromegaly present.  Cardiovascular: Normal rate, regular rhythm, S1 normal and S2 normal.  Exam reveals no gallop.   No murmur heard. Pulmonary/Chest: No accessory muscle usage or stridor. No respiratory distress. She has no wheezes. She has no rhonchi. She has no rales.  Abdominal: Soft. She exhibits no distension and no mass. There is no tenderness. There is no rebound and no guarding.  Musculoskeletal: She exhibits no edema.  Lymphadenopathy:       Head (right side): No tonsillar adenopathy present.       Head (left side): No tonsillar adenopathy present.    She has no cervical adenopathy.  Neurological: She is alert.   Skin: Rash noted. She is not diaphoretic.  Perioral dermatitis with very minute papules and surrounding erythema affecting periorbital region including chin up to lower cheek.  Psychiatric: She has a normal mood and affect. Her behavior is normal.    Diagnostics: None. Allergy skin tests were not performed secondary to the recent administration of antihistamine.  Assessment and Plan:    1. Perioral dermatitis   2. Allergic rhinoconjunctivitis      1. Bactroban cream- apply to face two times a day  2. OTC Rhinocort - one spray each nostril 3-7 times per week. Takes days to work  3. If needed: Loratadine  one tablet one time per day.  4. Return in four weeks. Further evaluation?  5. Get a flu vaccine  6. Discuss with gastroenterologist about screening colonoscopy for family history  We will treat Myrtis with Bactroban cream for perioral dermatitis and based upon her response we'll make a decision about how to proceed regarding further evaluation and treatment. She very well could be having adult onset acne which obviously would require a different form of therapy. I've given her some anti-inflammatory medications for her upper airways to treat her atopic disease. She has a strong family history of colon cancer and I've asked her to discuss with her family doctor about possibly being referred to a gastroenterologist to further discuss the indications for a screening colonoscopy with this history of colon cancer.    Laurette Schimke, MD [facility]

## 2015-06-20 ENCOUNTER — Other Ambulatory Visit: Payer: Self-pay | Admitting: Certified Nurse Midwife

## 2015-06-20 DIAGNOSIS — Z01419 Encounter for gynecological examination (general) (routine) without abnormal findings: Secondary | ICD-10-CM

## 2015-06-20 NOTE — Telephone Encounter (Signed)
Referral sent to [place].  Thank you. R.Aurelie Dicenzo CNM

## 2015-07-18 ENCOUNTER — Ambulatory Visit: Payer: Medicaid Other | Admitting: Allergy and Immunology

## 2016-03-10 ENCOUNTER — Other Ambulatory Visit: Payer: Self-pay | Admitting: Certified Nurse Midwife

## 2016-03-12 ENCOUNTER — Telehealth: Payer: Self-pay | Admitting: *Deleted

## 2016-03-13 ENCOUNTER — Telehealth: Payer: Self-pay | Admitting: *Deleted

## 2016-03-13 NOTE — Telephone Encounter (Signed)
Patient needs refill on birth control pills.Explained to patient that she is due for an annual exam that is why she has no refills left.Call sent to front for patient to schedule annual exam.Refill was sent for BCP to hold her until her next appointment.

## 2016-03-13 NOTE — Telephone Encounter (Signed)
Was unable to leave msg mailbox full.

## 2016-04-02 ENCOUNTER — Ambulatory Visit: Payer: Medicaid Other | Admitting: Certified Nurse Midwife

## 2016-04-02 ENCOUNTER — Ambulatory Visit: Payer: Self-pay | Admitting: Certified Nurse Midwife

## 2016-05-08 ENCOUNTER — Other Ambulatory Visit: Payer: Self-pay | Admitting: Certified Nurse Midwife

## 2016-07-25 ENCOUNTER — Encounter (HOSPITAL_COMMUNITY): Payer: Self-pay | Admitting: *Deleted

## 2016-07-25 DIAGNOSIS — Z3A01 Less than 8 weeks gestation of pregnancy: Secondary | ICD-10-CM | POA: Insufficient documentation

## 2016-07-25 DIAGNOSIS — O23591 Infection of other part of genital tract in pregnancy, first trimester: Secondary | ICD-10-CM | POA: Insufficient documentation

## 2016-07-25 DIAGNOSIS — O200 Threatened abortion: Secondary | ICD-10-CM | POA: Insufficient documentation

## 2016-07-25 LAB — POC URINE PREG, ED: PREG TEST UR: POSITIVE — AB

## 2016-07-25 NOTE — ED Triage Notes (Signed)
Pt complains of vaginal bleeding for the past few days. Pt states she tested positive for pregnancy on Saturday. Pt's last menstrual period was 11/10. Pt denies pain at this time, states she initially had some cramping.

## 2016-07-26 ENCOUNTER — Emergency Department (HOSPITAL_COMMUNITY)

## 2016-07-26 ENCOUNTER — Emergency Department (HOSPITAL_COMMUNITY)
Admission: EM | Admit: 2016-07-26 | Discharge: 2016-07-26 | Disposition: A | Discharge status: Home / Self Care | Attending: Emergency Medicine | Admitting: Emergency Medicine

## 2016-07-26 DIAGNOSIS — B9689 Other specified bacterial agents as the cause of diseases classified elsewhere: Secondary | ICD-10-CM

## 2016-07-26 DIAGNOSIS — N939 Abnormal uterine and vaginal bleeding, unspecified: Secondary | ICD-10-CM

## 2016-07-26 DIAGNOSIS — N760 Acute vaginitis: Secondary | ICD-10-CM

## 2016-07-26 DIAGNOSIS — O200 Threatened abortion: Secondary | ICD-10-CM

## 2016-07-26 DIAGNOSIS — Z3490 Encounter for supervision of normal pregnancy, unspecified, unspecified trimester: Secondary | ICD-10-CM

## 2016-07-26 LAB — WET PREP, GENITAL
SPERM: NONE SEEN
TRICH WET PREP: NONE SEEN
YEAST WET PREP: NONE SEEN

## 2016-07-26 LAB — BASIC METABOLIC PANEL
Anion gap: 9 (ref 5–15)
BUN: 5 mg/dL — AB (ref 6–20)
CALCIUM: 8.9 mg/dL (ref 8.9–10.3)
CO2: 21 mmol/L — ABNORMAL LOW (ref 22–32)
Chloride: 106 mmol/L (ref 101–111)
Creatinine, Ser: 0.73 mg/dL (ref 0.44–1.00)
GFR calc Af Amer: >60 mL/min (ref >60)
Glucose, Bld: 90 mg/dL (ref 65–99)
POTASSIUM: 3.2 mmol/L — AB (ref 3.5–5.1)
SODIUM: 136 mmol/L (ref 135–145)

## 2016-07-26 LAB — URINALYSIS, ROUTINE W REFLEX MICROSCOPIC
Bilirubin Urine: NEGATIVE
Glucose, UA: NEGATIVE mg/dL
Ketones, ur: NEGATIVE mg/dL
Nitrite: NEGATIVE
Protein, ur: NEGATIVE mg/dL
SPECIFIC GRAVITY, URINE: 1.011 (ref 1.005–1.030)
pH: 5 (ref 5.0–8.0)

## 2016-07-26 LAB — CBC
HCT: 31.4 % — ABNORMAL LOW (ref 36.0–46.0)
Hemoglobin: 10.2 g/dL — ABNORMAL LOW (ref 12.0–15.0)
MCH: 24.9 pg — AB (ref 26.0–34.0)
MCHC: 32.5 g/dL (ref 30.0–36.0)
MCV: 76.6 fL — ABNORMAL LOW (ref 78.0–100.0)
PLATELETS: 249 10*3/uL (ref 150–400)
RBC: 4.1 MIL/uL (ref 3.87–5.11)
RDW: 15.1 % (ref 11.5–15.5)
WBC: 9 10*3/uL (ref 4.0–10.5)

## 2016-07-26 LAB — HCG, QUANTITATIVE, PREGNANCY: HCG, BETA CHAIN, QUANT, S: 44338 m[IU]/mL — AB (ref <5)

## 2016-07-26 MED ORDER — CEPHALEXIN 500 MG PO CAPS: 500.0000 mg | ORAL_CAPSULE | Freq: Two times a day (BID) | ORAL | 0 refills | Status: DC

## 2016-07-26 MED ORDER — METRONIDAZOLE 500 MG PO TABS: 500.0000 mg | ORAL_TABLET | Freq: Two times a day (BID) | ORAL | 0 refills | Status: DC

## 2016-07-26 NOTE — Discharge Instructions (Signed)
Your ultrasound today showed a pregnancy that is 6 weeks and 2 days with a small subchorionic hemorrhage.  Follow up with your OBGYN for recheck.  Get rechecked immediately if you develop severe bleeding or new concerning symptoms.

## 2016-07-26 NOTE — ED Provider Notes (Signed)
[facility]-EMERGENCY DEPT Provider Note   CSN: [id number] Arrival date & time: 07/25/16  2031   By signing my name below, I, Melanie Calderon, attest that this documentation has been prepared under the direction and in the presence of No att. providers found. Electronically signed, Melanie Calderon, ED Scribe. 07/26/16. 7:52 AM.   History   Chief Complaint Chief Complaint  Patient presents with  . Vaginal Bleeding  The history is provided by the patient. No language interpreter was used.    HPI Comments: Melanie Calderon is a 25 y.o. female who presents to the Emergency Department complaining of episodic, unchanged vaginal bleeding x 5 days. She states she took a pregnancy test 6 days ago, and it read positive. Pt reports associated severe cramping, and she states that the amount of blood is less than her normal menstrual bleeding. LNMP 06/13/2016. G:2/P:2/A:0 (oldest child 17 years old). Pt reports both children were delivered via c-section (first breached and second by choice). Pt denies fever, vomit and dysuria.      Past Medical History:  Diagnosis Date  . Anemia   . Anemia   . Scoliosis     Patient Active Problem List   Diagnosis Date Noted  . Family history of colon cancer 06/19/2015  . Allergic rhinoconjunctivitis 06/19/2015  . Perioral dermatitis 06/19/2015    Past Surgical History:  Procedure Laterality Date  . CAESA    . CESAREAN SECTION      OB History    Gravida Para Term Preterm AB Living   3 1           SAB TAB Ectopic Multiple Live Births                   Home Medications    Prior to Admission medications   Medication Sig Start Date End Date Taking? Authorizing Provider  Levonorgest-Eth Est & Eth Est (QUARTETTE) 42-21-21-7 DAYS TABS Take 1 tablet by mouth daily. 02/27/15  Yes Rachelle A Denney, CNM  cephALEXin (KEFLEX) 500 MG capsule Take 1 capsule (500 mg total) by mouth 2 (two) times daily. 07/26/16   Tilden Fossa, MD  [facility] LORATADINE-D 24 HOUR  10-240 MG per 24 hr tablet TAKE 1 TABLET BY MOUTH DAILY. Patient not taking: Reported on 06/19/2015 03/15/15   Brock Bad, MD  DAYSEE 0.15-0.03 &0.01 MG tablet TAKE 1 TABLET EVERY DAY Patient not taking: Reported on 07/26/2016 03/11/16   Brock Bad, MD  fluconazole (DIFLUCAN) 100 MG tablet Take 1 tablet (100 mg total) by mouth daily. Patient not taking: Reported on 06/19/2015 04/12/15   Rachelle A Denney, CNM  fluticasone (FLONASE) 50 MCG/ACT nasal spray Place 2 sprays into both nostrils daily. Patient taking differently: Place 2 sprays into both nostrils daily as needed for allergies.  12/20/14 12/20/15  Rachelle A Denney, CNM  Iron-FA-B Cmp-C-Biot-Probiotic (FUSION PLUS) CAPS Take 1 capsule by mouth daily. Patient not taking: Reported on 07/26/2016 12/26/14   Rachelle A Denney, CNM  loratadine (CLARITIN) 10 MG tablet TAKE 1 TABLET (10 MG TOTAL) BY MOUTH DAILY. Patient not taking: Reported on 07/26/2016 05/12/16   Brock Bad, MD  meloxicam (MOBIC) 15 MG tablet Take 1 tablet (15 mg total) by mouth daily. Patient not taking: Reported on 06/19/2015 02/15/15   Emilia Beck, PA-C  metroNIDAZOLE (FLAGYL) 500 MG tablet Take 1 tablet (500 mg total) by mouth 2 (two) times daily. 07/26/16   Tilden Fossa, MD  mupirocin cream (BACTROBAN) 2 % Apply to face two times  daily as directed. Patient not taking: Reported on 07/26/2016 06/19/15   Jessica PriestEric J Kozlow, MD  penicillin v potassium (VEETID) 500 MG tablet Take 1 tablet (500 mg total) by mouth 4 (four) times daily. Patient not taking: Reported on 06/19/2015 02/15/15   Emilia BeckKaitlyn Szekalski, PA-C  terconazole (TERAZOL 7) 0.4 % vaginal cream Place 1 applicator vaginally at bedtime. Patient not taking: Reported on 06/19/2015 04/12/15   Roe Coombsachelle A Denney, CNM    Family History Family History  Problem Relation Age of Onset  . Cancer Mother     colon cancer  . Diabetes Mother   . Hypertension Mother     Social History Social History  Substance Use  Topics  . Smoking status: Never Smoker  . Smokeless tobacco: Never Used  . Alcohol use 0.0 oz/week     Comment: occassional wine     Allergies   Patient has no known allergies.   Review of Systems Review of Systems  All other systems reviewed and are negative. A complete 10 system review of systems was obtained and all systems are negative except as noted in the HPI and PMH.     Physical Exam Updated Vital Signs BP 110/66 (BP Location: Right Arm)   Pulse 76   Temp 98.1 F (36.7 C) (Oral)   Resp 14   LMP 06/13/2016   SpO2 98%   Physical Exam  Constitutional: She is oriented to person, place, and time. She appears well-developed and well-nourished.  HENT:  Head: Normocephalic and atraumatic.  Cardiovascular: Normal rate and regular rhythm.   No murmur heard. Pulmonary/Chest: Effort normal and breath sounds normal. No respiratory distress.  Abdominal: Soft. There is tenderness. There is no rebound and no guarding.  Mild suprapubic tenderness.  Genitourinary:  Genitourinary Comments: Small amount of vaginal bleeding, os closed, no CMT  Musculoskeletal: She exhibits no edema or tenderness.  Neurological: She is alert and oriented to person, place, and time.  Skin: Skin is warm and dry.  Psychiatric: She has a normal mood and affect. Her behavior is normal.  Nursing note and vitals reviewed.    ED Treatments / Results  DIAGNOSTIC STUDIES: Oxygen Saturation is 100% on RA, normal by my interpretation.    COORDINATION OF CARE: 7:52 AM Will order labs and imaging. Discussed treatment plan with pt at bedside and pt agreed to plan.  Labs (all labs ordered are listed, but only abnormal results are displayed) Labs Reviewed  WET PREP, GENITAL - Abnormal; Notable for the following:       Result Value   Clue Cells Wet Prep HPF POC PRESENT (*)    WBC, Wet Prep HPF POC MANY (*)    All other components within normal limits  CBC - Abnormal; Notable for the following:     Hemoglobin 10.2 (*)    HCT 31.4 (*)    MCV 76.6 (*)    MCH 24.9 (*)    All other components within normal limits  BASIC METABOLIC PANEL - Abnormal; Notable for the following:    Potassium 3.2 (*)    CO2 21 (*)    BUN 5 (*)    All other components within normal limits  HCG, QUANTITATIVE, PREGNANCY - Abnormal; Notable for the following:    hCG, Beta Chain, Quant, S [id number],10944,[id number] (*)    All other components within normal limits  URINALYSIS, ROUTINE W REFLEX MICROSCOPIC - Abnormal; Notable for the following:    Hgb urine dipstick MODERATE (*)    Leukocytes, UA TRACE (*)  Bacteria, UA MANY (*)    Squamous Epithelial / LPF 6-30 (*)    All other components within normal limits  POC URINE PREG, ED - Abnormal; Notable for the following:    Preg Test, Ur POSITIVE (*)    All other components within normal limits  GC/CHLAMYDIA PROBE AMP ([place]) NOT AT [facility]    EKG  EKG Interpretation None       Radiology US Ob Comp < 14 Wks  Result Date: 07/26/2016 CLINICAL DATA:  26 year old female with vaginal bleeding. LMP 06/13/2016. EXAM: OBSTETRIC <14 WK [place] AND TRANSVAGINAL OB US TECHNIQUE: Both transabdominal and transvaginal ultrasound examinations were performed for complete evaluation of the gestation as well as the maternal uterus, adnexal regions, and pelvic cul-de-sac. Transvaginal technique was performed to assess early pregnancy. COMPARISON:  None. FINDINGS: Intrauterine gestational sac: Single intrauterine gestational sac. Yolk sac:  Seen Embryo:  Present Cardiac Activity: Detected Heart Rate: 118  bpm CRL:  5  mm   6 w   2 d                  [place] EDC: 03/19/2017 Subchorionic hemorrhage:  Small subchorionic hemorrhage Maternal uterus/adnexae: The right ovary is unremarkable. There is a 3 cm cyst in the left ovary. IMPRESSION: Single live intrauterine pregnancy with an estimated gestational age of [redacted] weeks, 2 days. Trace subchorionic hemorrhage. Electronically Signed   By: Elgie Collard M.D.    On: 07/26/2016 01:37   US Ob Transvaginal  Result Date: 07/26/2016 CLINICAL DATA:  25 year old female with vaginal bleeding. LMP 06/13/2016. EXAM: OBSTETRIC <14 WK [place] AND TRANSVAGINAL OB US TECHNIQUE: Both transabdominal and transvaginal ultrasound examinations were performed for complete evaluation of the gestation as well as the maternal uterus, adnexal regions, and pelvic cul-de-sac. Transvaginal technique was performed to assess early pregnancy. COMPARISON:  None. FINDINGS: Intrauterine gestational sac: Single intrauterine gestational sac. Yolk sac:  Seen Embryo:  Present Cardiac Activity: Detected Heart Rate: 118  bpm CRL:  5  mm   6 w   2 d                  [place] EDC: 03/19/2017 Subchorionic hemorrhage:  Small subchorionic hemorrhage Maternal uterus/adnexae: The right ovary is unremarkable. There is a 3 cm cyst in the left ovary. IMPRESSION: Single live intrauterine pregnancy with an estimated gestational age of [redacted] weeks, 2 days. Trace subchorionic hemorrhage. Electronically Signed   By: Elgie Collard M.D.   On: 07/26/2016 01:37    Procedures Procedures (including critical care time)  Medications Ordered in ED Medications - No data to display   Initial Impression / Assessment and Plan / ED Course  I have reviewed the triage vital signs and the nursing notes.  Pertinent labs & imaging results that were available during my care of the patient were reviewed by me and considered in my medical decision making (see chart for details).  Clinical Course     Patient here for evaluation of vaginal bleeding. She is in no distress in the emergency Department, hemoglobin is stable compared to priors. There is a small amount of bleeding on examination with a closed os. Pelvic ultrasound demonstrates IUP at 6 weeks and 2 days. Patient does endorse foul vaginal odor, wet prep is concerning for BV, will treat given her symptoms. UA with bacteria present, will treat for bacteriuria of pregnancy. Discussed  home care for threatened miscarriage, BV, bacteria. Discussed outpatient follow-up or depressions.   Patient with prior blood  type of O+ in the computer system.  Final Clinical Impressions(s) / ED Diagnoses   Final diagnoses:  Vaginal bleeding  Pregnant  BV (bacterial vaginosis)  Threatened miscarriage    New Prescriptions Discharge Medication List as of 07/26/2016  3:10 AM    START taking these medications   Details  metroNIDAZOLE (FLAGYL) 500 MG tablet Take 1 tablet (500 mg total) by mouth 2 (two) times daily., Starting Sat 07/26/2016, Print      I personally performed the services described in this documentation, which was scribed in my presence. The recorded information has been reviewed and is accurate.    Tilden FossaElizabeth Kerrick Miler, MD 07/26/16 [phone]

## 2016-07-29 LAB — GC/CHLAMYDIA PROBE AMP (~~LOC~~) NOT AT ARMC
Chlamydia: NEGATIVE
NEISSERIA GONORRHEA: NEGATIVE

## 2016-09-11 ENCOUNTER — Telehealth: Payer: Self-pay

## 2016-09-11 NOTE — Telephone Encounter (Signed)
TC from pt states she [facility] and needs another rx. Last AEX 12/2014. Informed pt she needs an appt for AEX. Pt did not inform us of recent pregnancy. I noticed pt had recent u/s 07/26/16 and it indicated she had a viable pregnancy at 6970w2d. Then pt informed me she miscarried and has not followed up with a provider regarding recent miscarriage. Informed pt we are unable to approve Bc at this time. She needs a f/u appt ASAP, to ensure quant is negative before [facility]. Call forwarded to appts.

## 2016-09-29 ENCOUNTER — Encounter: Payer: Self-pay | Admitting: Obstetrics

## 2016-09-29 ENCOUNTER — Ambulatory Visit (INDEPENDENT_AMBULATORY_CARE_PROVIDER_SITE_OTHER): Payer: Self-pay | Admitting: Certified Nurse Midwife

## 2016-09-29 ENCOUNTER — Encounter: Payer: Self-pay | Admitting: *Deleted

## 2016-09-29 ENCOUNTER — Encounter: Payer: Self-pay | Admitting: Certified Nurse Midwife

## 2016-09-29 VITALS — BP 138/90 | HR 102 | Wt 132.0 lb

## 2016-09-29 DIAGNOSIS — O039 Complete or unspecified spontaneous abortion without complication: Secondary | ICD-10-CM | POA: Insufficient documentation

## 2016-09-29 DIAGNOSIS — B9689 Other specified bacterial agents as the cause of diseases classified elsewhere: Secondary | ICD-10-CM

## 2016-09-29 DIAGNOSIS — N760 Acute vaginitis: Secondary | ICD-10-CM

## 2016-09-29 DIAGNOSIS — Z5189 Encounter for other specified aftercare: Secondary | ICD-10-CM

## 2016-09-29 MED ORDER — METRONIDAZOLE 500 MG PO TABS: 500.0000 mg | ORAL_TABLET | Freq: Two times a day (BID) | ORAL | 0 refills | Status: DC

## 2016-09-29 NOTE — Progress Notes (Signed)
Patient ID: Melanie Calderon, female   DOB: 06-Apr-1991, 26 y.o.   MRN: [id number]  Chief Complaint  Patient presents with  . Follow-up    had SAB in early January    HPI Melanie FootmanFlorence M Valentina LucksGriffin is a 26 y.o. female.   Here for f/u on recent SAB the end of December.  States all POC were passed.  Is currently on her period.  Is currently sexually active.  States that she has vaginal odor and discharge.  Wants to start back on OCPs.  Needs annual exam.  Condoms given to patient.  HCG ordered today.  Slightly elevated blood pressure today, no hx of elevated blood pressures.   Is employed.    HPI  Past Medical History:  Diagnosis Date  . Anemia   . Anemia   . Scoliosis     Past Surgical History:  Procedure Laterality Date  . CAESA    . CESAREAN SECTION      Family History  Problem Relation Age of Onset  . Cancer Mother     colon cancer  . Diabetes Mother   . Hypertension Mother     Social History Social History  Substance Use Topics  . Smoking status: Never Smoker  . Smokeless tobacco: Never Used  . Alcohol use 0.0 oz/week     Comment: occassional wine    No Known Allergies  Current Outpatient Prescriptions  Medication Sig Dispense Refill  . Levonorgest-Eth Est & Eth Est (QUARTETTE) 42-21-21-7 DAYS TABS Take 1 tablet by mouth daily. 91 tablet 4  . DAYSEE 0.15-0.03 &0.01 MG tablet TAKE 1 TABLET EVERY DAY (Patient not taking: Reported on 07/26/2016) 91 tablet 3  . fluticasone (FLONASE) 50 MCG/ACT nasal spray Place 2 sprays into both nostrils daily. (Patient taking differently: Place 2 sprays into both nostrils daily as needed for allergies. ) 16 g 2  . Iron-FA-B Cmp-C-Biot-Probiotic (FUSION PLUS) CAPS Take 1 capsule by mouth daily. (Patient not taking: Reported on 07/26/2016) 30 capsule 5  . meloxicam (MOBIC) 15 MG tablet Take 1 tablet (15 mg total) by mouth daily. (Patient not taking: Reported on 06/19/2015) 20 tablet 0  . metroNIDAZOLE (FLAGYL) 500 MG tablet Take 1 tablet  (500 mg total) by mouth 2 (two) times daily. 14 tablet 0  . mupirocin cream (BACTROBAN) 2 % Apply to face two times daily as directed. (Patient not taking: Reported on 07/26/2016) 30 g 0   No current facility-administered medications for this visit.     Review of Systems Review of Systems Constitutional: negative for fatigue and weight loss Respiratory: negative for cough and wheezing Cardiovascular: negative for chest pain, fatigue and palpitations Gastrointestinal: negative for abdominal pain and change in bowel habits Genitourinary:negative Integument/breast: negative for nipple discharge Musculoskeletal:negative for myalgias Neurological: negative for gait problems and tremors Behavioral/Psych: negative for abusive relationship, depression Endocrine: negative for temperature intolerance      Blood pressure 138/90, pulse (!) 102, weight 132 lb (59.9 kg), last menstrual period 09/22/2016, unknown if currently breastfeeding.  Physical Exam Physical Exam General:   alert  Skin:   no rash or abnormalities  Lungs:   clear to auscultation bilaterally  Heart:   regular rate and rhythm, S1, S2 normal, no murmur, click, rub or gallop  Breasts:   deferred  Abdomen:  normal findings: no organomegaly, soft, non-tender and no hernia  Pelvis:  External genitalia: normal general appearance Urinary system: urethral meatus normal and bladder without fullness, nontender Vaginal: normal without tenderness, induration or masses Cervix:  normal appearance Adnexa: normal bimanual exam Uterus: anteverted and non-tender, normal size    50% of 15 min visit spent on counseling and coordination of care.    Data Reviewed Previous medical hx, meds, labs  Assessment      Recent SAB    Plan    Orders Placed This Encounter  Procedures  . B-HCG Quant   Meds ordered this encounter  Medications  . metroNIDAZOLE (FLAGYL) 500 MG tablet    Sig: Take 1 tablet (500 mg total) by mouth 2 (two) times  daily.    Dispense:  14 tablet    Refill:  0     Possible management options include:    LARK Follow up with annual exam.

## 2016-09-29 NOTE — Addendum Note (Signed)
Addended by: Marya LandryFOSTER, Conway Fedora D on: 09/29/2016 02:06 PM   Modules accepted: Orders

## 2016-09-30 LAB — BETA HCG QUANT (REF LAB)

## 2016-10-06 ENCOUNTER — Telehealth: Payer: Self-pay

## 2016-10-06 NOTE — Telephone Encounter (Signed)
Returned call, no answer, vm is not setup 

## 2017-03-18 ENCOUNTER — Ambulatory Visit: Admitting: Obstetrics & Gynecology

## 2017-07-15 ENCOUNTER — Ambulatory Visit (INDEPENDENT_AMBULATORY_CARE_PROVIDER_SITE_OTHER)

## 2017-07-15 ENCOUNTER — Encounter: Payer: Self-pay | Admitting: Obstetrics

## 2017-07-15 VITALS — BP 120/82 | HR 80

## 2017-07-15 DIAGNOSIS — Z3201 Encounter for pregnancy test, result positive: Secondary | ICD-10-CM

## 2017-07-15 DIAGNOSIS — N912 Amenorrhea, unspecified: Secondary | ICD-10-CM | POA: Diagnosis not present

## 2017-07-15 LAB — POCT URINE PREGNANCY: PREG TEST UR: POSITIVE — AB

## 2017-07-15 NOTE — Progress Notes (Signed)
Pt here for a pregnancy test. Test today is positive. LMP 05/30/17. Pt is 6725w4d today. Patient given samples today

## 2017-08-17 ENCOUNTER — Other Ambulatory Visit (HOSPITAL_COMMUNITY)
Admission: RE | Admit: 2017-08-17 | Discharge: 2017-08-17 | Disposition: A | Discharge status: Home / Self Care | Payer: Medicaid Other | Source: Ambulatory Visit | Attending: Certified Nurse Midwife | Admitting: Certified Nurse Midwife

## 2017-08-17 ENCOUNTER — Ambulatory Visit (INDEPENDENT_AMBULATORY_CARE_PROVIDER_SITE_OTHER): Payer: Medicaid Other | Admitting: Certified Nurse Midwife

## 2017-08-17 ENCOUNTER — Encounter: Payer: Self-pay | Admitting: Certified Nurse Midwife

## 2017-08-17 VITALS — BP 130/83 | HR 66 | Wt 139.2 lb

## 2017-08-17 DIAGNOSIS — Z3480 Encounter for supervision of other normal pregnancy, unspecified trimester: Secondary | ICD-10-CM | POA: Diagnosis present

## 2017-08-17 DIAGNOSIS — Z98891 History of uterine scar from previous surgery: Secondary | ICD-10-CM

## 2017-08-17 DIAGNOSIS — Z3481 Encounter for supervision of other normal pregnancy, first trimester: Secondary | ICD-10-CM

## 2017-08-17 DIAGNOSIS — O219 Vomiting of pregnancy, unspecified: Secondary | ICD-10-CM

## 2017-08-17 MED ORDER — DOXYLAMINE-PYRIDOXINE 10-10 MG PO TBEC: DELAYED_RELEASE_TABLET | ORAL | 4 refills | Status: DC

## 2017-08-17 MED ORDER — PROMETHAZINE HCL 12.5 MG PO TABS: 12.5000 mg | ORAL_TABLET | Freq: Four times a day (QID) | ORAL | 0 refills | Status: DC | PRN

## 2017-08-17 MED ORDER — PRENATE PIXIE 10-0.6-0.4-200 MG PO CAPS: 1.0000 | ORAL_CAPSULE | Freq: Every day | ORAL | 12 refills | Status: DC

## 2017-08-17 NOTE — Progress Notes (Signed)
Pt states she would like rx for allergy medication. She c/o seasonal allergy sx.

## 2017-08-17 NOTE — Progress Notes (Signed)
Subjective:   Melanie Calderon is a 27 y.o. [id number] at 3539w2d by LMP being seen today for her first obstetrical visit.  Her obstetrical history is significant for obesity. Patient does intend to breast feed. Pregnancy history fully reviewed.  Patient reports nausea, no bleeding, no contractions, no cramping and no leaking.  HISTORY: Obstetric History   G4   P2   T2   P0   A1   L2    SAB1   TAB0   Ectopic0   Multiple0   Live Births2     # Outcome Date GA Lbr Len/2nd Weight Sex Delivery Anes PTL Lv  4 Current           3 SAB 2018 3887w0d         2 Term 01/19/12     CS-LTranv  N LIV  1 Term 01/19/05   6 lb 9 oz (2.977 kg)  CS-LTranv  N LIV     Past Medical History:  Diagnosis Date  . Anemia   . Anemia   . Scoliosis    Past Surgical History:  Procedure Laterality Date  . CAESA    . CESAREAN SECTION     x2   Family History  Problem Relation Age of Onset  . Cancer Mother        colon cancer  . Diabetes Mother   . Hypertension Mother   . Diabetes Father   . Hypertension Father   . Kidney disease Maternal Aunt   . Hypertension Maternal Aunt   . Diabetes Maternal Aunt   . Cancer Maternal Aunt        Breast  . Diabetes Maternal Uncle   . Hypertension Maternal Uncle   . Cancer Maternal Grandmother   . Hypertension Maternal Grandfather    Social History   Tobacco Use  . Smoking status: Never Smoker  . Smokeless tobacco: Never Used  Substance Use Topics  . Alcohol use: No    Alcohol/week: 0.0 oz    Frequency: Never  . Drug use: No   No Known Allergies Current Outpatient Medications on File Prior to Visit  Medication Sig Dispense Refill  . Prenatal Vit-Fe Fumarate-FA (MULTIVITAMIN-PRENATAL) 27-0.8 MG TABS tablet Take 1 tablet by mouth daily at 12 noon.     No current facility-administered medications on file prior to visit.      Exam   Vitals:   08/17/17 1418  BP: 130/83  Pulse: 66  Weight: 139 lb 3.2 oz (63.1 kg)      Uterus:     Pelvic Exam:  Perineum: no hemorrhoids, normal perineum   Vulva: normal external genitalia, no lesions   Vagina:  normal mucosa, normal discharge   Cervix: no lesions and normal, pap smear done.    Adnexa: normal adnexa and no mass, fullness, tenderness   Bony Pelvis: average  System: General: well-developed, well-nourished female in no acute distress   Breast:  normal appearance, no masses or tenderness   Skin: normal coloration and turgor, no rashes   Neurologic: oriented, normal, negative, normal mood   Extremities: normal strength, tone, and muscle mass, ROM of all joints is normal   HEENT PERRLA, extraocular movement intact and sclera clear, anicteric   Mouth/Teeth mucous membranes moist, pharynx normal without lesions and dental hygiene good   Neck supple and no masses   Cardiovascular: regular rate and rhythm   Respiratory:  no respiratory distress, normal breath sounds   Abdomen: soft, non-tender; bowel sounds normal;  no masses,  no organomegaly     Assessment:   Pregnancy: [id number] Patient Active Problem List   Diagnosis Date Noted  . Supervision of other normal pregnancy, antepartum 08/17/2017  . History of C-section 08/17/2017  . Follow-up visit after miscarriage 09/29/2016  . Family history of colon cancer 06/19/2015  . Allergic rhinoconjunctivitis 06/19/2015  . Perioral dermatitis 06/19/2015     Plan:  1. Supervision of other normal pregnancy, antepartum      - Cervicovaginal ancillary only - Culture, OB Urine - Cytology - PAP - Genetic Screening - Hemoglobinopathy evaluation - Inheritest Core(CF97,SMA,FraX) - Obstetric Panel, Including HIV - VITAMIN D 25 Hydroxy (Vit-D Deficiency, Fractures) - Hemoglobin A1c - Prenat-FeAsp-Meth-FA-DHA w/o A (PRENATE PIXIE) 10-0.6-0.4-200 MG CAPS; Take 1 tablet by mouth daily.  Dispense: 30 capsule; Refill: 12  2. Nausea and vomiting during pregnancy      - promethazine (PHENERGAN) 12.5 MG tablet; Take 1 tablet (12.5 mg total) by mouth  every 6 (six) hours as needed for nausea or vomiting.  Dispense: 30 tablet; Refill: 0 - Doxylamine-Pyridoxine (DICLEGIS) 10-10 MG TBEC; Take 1 tablet with breakfast and lunch.  Take 2 tablets at bedtime.  Dispense: 100 tablet; Refill: 4  3. History of C-section     Repeat C-section @39  weeks   Initial labs drawn. Continue prenatal vitamins. Genetic Screening discussed, NIPS: ordered. Ultrasound discussed; fetal anatomic survey: ordered. Problem list reviewed and updated. The nature of [facility] with multiple MDs and other Advanced Practice Providers was explained to patient; also emphasized that residents, students are part of our team. Routine obstetric precautions reviewed. Return in about 4 weeks (around 09/14/2017) for ROB.     Orvilla Cornwall; CNM, [facility], [facility]

## 2017-08-18 LAB — CERVICOVAGINAL ANCILLARY ONLY
BACTERIAL VAGINITIS: NEGATIVE
CANDIDA VAGINITIS: NEGATIVE
CHLAMYDIA, DNA PROBE: NEGATIVE
NEISSERIA GONORRHEA: NEGATIVE
TRICH (WINDOWPATH): NEGATIVE

## 2017-08-19 LAB — OBSTETRIC PANEL, INCLUDING HIV
Antibody Screen: NEGATIVE
Basophils Absolute: 0 10*3/uL (ref 0.0–0.2)
Basos: 0 %
EOS (ABSOLUTE): 0.4 10*3/uL (ref 0.0–0.4)
EOS: 4 %
HEMOGLOBIN: 9.6 g/dL — AB (ref 11.1–15.9)
HEP B S AG: NEGATIVE
HIV Screen 4th Generation wRfx: NONREACTIVE
Hematocrit: 30.3 % — ABNORMAL LOW (ref 34.0–46.6)
IMMATURE GRANS (ABS): 0 10*3/uL (ref 0.0–0.1)
IMMATURE GRANULOCYTES: 0 %
LYMPHS: 24 %
Lymphocytes Absolute: 2.1 10*3/uL (ref 0.7–3.1)
MCH: 22.7 pg — ABNORMAL LOW (ref 26.6–33.0)
MCHC: 31.7 g/dL (ref 31.5–35.7)
MCV: 72 fL — ABNORMAL LOW (ref 79–97)
Monocytes Absolute: 0.6 10*3/uL (ref 0.1–0.9)
Monocytes: 6 %
NEUTROS PCT: 66 %
Neutrophils Absolute: 5.8 10*3/uL (ref 1.4–7.0)
Platelets: 312 10*3/uL (ref 150–379)
RBC: 4.22 x10E6/uL (ref 3.77–5.28)
RDW: 21.1 % — ABNORMAL HIGH (ref 12.3–15.4)
RH TYPE: POSITIVE
RPR Ser Ql: NONREACTIVE
RUBELLA: 1.18 {index} (ref >0.99)
WBC: 8.8 10*3/uL (ref 3.4–10.8)

## 2017-08-19 LAB — HEMOGLOBINOPATHY EVALUATION
HEMOGLOBIN A2 QUANTITATION: 1.9 % (ref 1.8–3.2)
HEMOGLOBIN F QUANTITATION: 0 % (ref 0.0–2.0)
HGB C: 0 %
HGB S: 0 %
HGB VARIANT: 0 %
Hgb A: 98.1 % (ref 96.4–98.8)

## 2017-08-19 LAB — HEMOGLOBIN A1C
Est. average glucose Bld gHb Est-mCnc: 105 mg/dL
Hgb A1c MFr Bld: 5.3 % (ref 4.8–5.6)

## 2017-08-19 LAB — CYTOLOGY - PAP: DIAGNOSIS: NEGATIVE

## 2017-08-19 LAB — CULTURE, OB URINE

## 2017-08-19 LAB — URINE CULTURE, OB REFLEX

## 2017-08-19 LAB — VITAMIN D 25 HYDROXY (VIT D DEFICIENCY, FRACTURES): VIT D 25 HYDROXY: 24.8 ng/mL — AB (ref 30.0–100.0)

## 2017-08-26 ENCOUNTER — Other Ambulatory Visit: Payer: Self-pay | Admitting: Certified Nurse Midwife

## 2017-08-26 ENCOUNTER — Telehealth: Payer: Self-pay

## 2017-08-26 DIAGNOSIS — E559 Vitamin D deficiency, unspecified: Secondary | ICD-10-CM

## 2017-08-26 DIAGNOSIS — O99012 Anemia complicating pregnancy, second trimester: Secondary | ICD-10-CM

## 2017-08-26 DIAGNOSIS — Z3480 Encounter for supervision of other normal pregnancy, unspecified trimester: Secondary | ICD-10-CM

## 2017-08-26 MED ORDER — VITAMIN D (ERGOCALCIFEROL) 1.25 MG (50000 UNIT) PO CAPS: 50000.0000 [IU] | ORAL_CAPSULE | ORAL | 2 refills | Status: DC

## 2017-08-26 MED ORDER — CITRANATAL BLOOM 90-1 MG PO TABS: 1.0000 | ORAL_TABLET | Freq: Every day | ORAL | 12 refills | Status: DC

## 2017-08-26 NOTE — Telephone Encounter (Signed)
Pt requested test results for Panorama and the gender: Low risk girl.

## 2017-08-27 ENCOUNTER — Other Ambulatory Visit: Payer: Self-pay | Admitting: Certified Nurse Midwife

## 2017-08-27 DIAGNOSIS — Z3480 Encounter for supervision of other normal pregnancy, unspecified trimester: Secondary | ICD-10-CM

## 2017-08-27 LAB — INHERITEST CORE(CF97,SMA,FRAX)

## 2017-09-03 ENCOUNTER — Encounter: Payer: Self-pay | Admitting: Certified Nurse Midwife

## 2017-09-03 ENCOUNTER — Other Ambulatory Visit: Payer: Self-pay | Admitting: Certified Nurse Midwife

## 2017-09-03 DIAGNOSIS — Z3480 Encounter for supervision of other normal pregnancy, unspecified trimester: Secondary | ICD-10-CM

## 2017-09-14 ENCOUNTER — Encounter: Payer: Medicaid Other | Admitting: Certified Nurse Midwife

## 2017-09-14 ENCOUNTER — Ambulatory Visit (INDEPENDENT_AMBULATORY_CARE_PROVIDER_SITE_OTHER): Payer: Medicaid Other | Admitting: Certified Nurse Midwife

## 2017-09-14 ENCOUNTER — Encounter: Payer: Self-pay | Admitting: Certified Nurse Midwife

## 2017-09-14 VITALS — BP 110/77 | HR 96 | Wt 143.0 lb

## 2017-09-14 DIAGNOSIS — Z98891 History of uterine scar from previous surgery: Secondary | ICD-10-CM

## 2017-09-14 DIAGNOSIS — E559 Vitamin D deficiency, unspecified: Secondary | ICD-10-CM

## 2017-09-14 DIAGNOSIS — Z3480 Encounter for supervision of other normal pregnancy, unspecified trimester: Secondary | ICD-10-CM

## 2017-09-15 ENCOUNTER — Encounter: Payer: Self-pay | Admitting: Certified Nurse Midwife

## 2017-09-15 NOTE — Progress Notes (Signed)
   PRENATAL VISIT NOTE  Subjective:  Melanie Calderon is a 27 y.o. [phone] at 4327w4d being seen today for ongoing prenatal care.  She is currently monitored for the following issues for this low-risk pregnancy and has Family history of colon cancer; Allergic rhinoconjunctivitis; Perioral dermatitis; Follow-up visit after miscarriage; Supervision of other normal pregnancy, antepartum; History of C-section; Vitamin D deficiency; and Anemia during pregnancy in second trimester on their problem list.  Patient reports backache, no bleeding, no contractions, no cramping and no leaking.  Contractions: Not present. Vag. Bleeding: None.  Movement: Present. Denies leaking of fluid.   The following portions of the patient's history were reviewed and updated as appropriate: allergies, current medications, past family history, past medical history, past social history, past surgical history and problem list. Problem list updated.  Objective:   Vitals:   09/14/17 1551  BP: 110/77  Pulse: 96  Weight: 143 lb (64.9 kg)    Fetal Status: Fetal Heart Rate (bpm): 160; doppler   Movement: Present     General:  Alert, oriented and cooperative. Patient is in no acute distress.  Skin: Skin is warm and dry. No rash noted.   Cardiovascular: Normal heart rate noted  Respiratory: Normal respiratory effort, no problems with respiration noted  Abdomen: Soft, gravid, appropriate for gestational age.  Pain/Pressure: Absent     Pelvic: Cervical exam deferred        Extremities: Normal range of motion.  Edema: None  Mental Status:  Normal mood and affect. Normal behavior. Normal judgment and thought content.   Assessment and Plan:  Pregnancy: [id number] at 6727w4d  1. Supervision of other normal pregnancy, antepartum      - US MFM OB COMP + 14 WK; Future - AFP, Serum, Open Spina Bifida  2. Vitamin D deficiency     Taking weekly vitamin D  3. History of C-section     Repeat C-section  Preterm labor symptoms and  general obstetric precautions including but not limited to vaginal bleeding, contractions, leaking of fluid and fetal movement were reviewed in detail with the patient. Please refer to After Visit Summary for other counseling recommendations.  Return in about 4 weeks (around 10/12/2017) for ROB.   Roe Coombsachelle A Denney, CNM

## 2017-09-17 LAB — AFP, SERUM, OPEN SPINA BIFIDA
AFP MOM: 0.76
AFP VALUE AFPOSL: 29.1 ng/mL
Gest. Age on Collection Date: 16.4 weeks
Maternal Age At EDD: 29.2 yr
OSBR RISK 1 IN: 10000
Test Results:: NEGATIVE
Weight: 143 [lb_av]

## 2017-09-18 ENCOUNTER — Encounter: Payer: Medicaid Other | Admitting: Certified Nurse Midwife

## 2017-09-19 ENCOUNTER — Other Ambulatory Visit: Payer: Self-pay | Admitting: Certified Nurse Midwife

## 2017-09-19 DIAGNOSIS — Z3480 Encounter for supervision of other normal pregnancy, unspecified trimester: Secondary | ICD-10-CM

## 2017-09-28 ENCOUNTER — Encounter (HOSPITAL_COMMUNITY): Payer: Self-pay | Admitting: Certified Nurse Midwife

## 2017-09-29 ENCOUNTER — Other Ambulatory Visit (HOSPITAL_COMMUNITY): Payer: Medicaid Other

## 2017-10-02 ENCOUNTER — Ambulatory Visit (HOSPITAL_COMMUNITY)
Admission: RE | Admit: 2017-10-02 | Discharge: 2017-10-02 | Disposition: A | Discharge status: Home / Self Care | Payer: Medicaid Other | Source: Ambulatory Visit | Attending: Certified Nurse Midwife | Admitting: Certified Nurse Midwife

## 2017-10-02 ENCOUNTER — Other Ambulatory Visit: Payer: Self-pay | Admitting: Certified Nurse Midwife

## 2017-10-02 DIAGNOSIS — Z363 Encounter for antenatal screening for malformations: Secondary | ICD-10-CM | POA: Diagnosis present

## 2017-10-02 DIAGNOSIS — Z3480 Encounter for supervision of other normal pregnancy, unspecified trimester: Secondary | ICD-10-CM

## 2017-10-02 DIAGNOSIS — Z3A19 19 weeks gestation of pregnancy: Secondary | ICD-10-CM

## 2017-10-02 DIAGNOSIS — Z369 Encounter for antenatal screening, unspecified: Secondary | ICD-10-CM

## 2017-10-05 ENCOUNTER — Ambulatory Visit (HOSPITAL_COMMUNITY): Payer: Medicaid Other

## 2017-10-05 ENCOUNTER — Other Ambulatory Visit: Payer: Self-pay | Admitting: Certified Nurse Midwife

## 2017-10-12 ENCOUNTER — Other Ambulatory Visit: Payer: Self-pay

## 2017-10-12 ENCOUNTER — Ambulatory Visit (INDEPENDENT_AMBULATORY_CARE_PROVIDER_SITE_OTHER): Payer: Medicaid Other | Admitting: Certified Nurse Midwife

## 2017-10-12 VITALS — BP 113/75 | HR 88 | Wt 142.8 lb

## 2017-10-12 DIAGNOSIS — O99012 Anemia complicating pregnancy, second trimester: Secondary | ICD-10-CM

## 2017-10-12 DIAGNOSIS — E559 Vitamin D deficiency, unspecified: Secondary | ICD-10-CM

## 2017-10-12 DIAGNOSIS — Z98891 History of uterine scar from previous surgery: Secondary | ICD-10-CM

## 2017-10-12 DIAGNOSIS — Z3482 Encounter for supervision of other normal pregnancy, second trimester: Secondary | ICD-10-CM

## 2017-10-12 DIAGNOSIS — O34219 Maternal care for unspecified type scar from previous cesarean delivery: Secondary | ICD-10-CM

## 2017-10-12 DIAGNOSIS — L308 Other specified dermatitis: Secondary | ICD-10-CM

## 2017-10-12 DIAGNOSIS — D649 Anemia, unspecified: Secondary | ICD-10-CM

## 2017-10-12 DIAGNOSIS — T7840XA Allergy, unspecified, initial encounter: Secondary | ICD-10-CM

## 2017-10-12 DIAGNOSIS — L309 Dermatitis, unspecified: Secondary | ICD-10-CM | POA: Insufficient documentation

## 2017-10-12 DIAGNOSIS — Z3480 Encounter for supervision of other normal pregnancy, unspecified trimester: Secondary | ICD-10-CM

## 2017-10-12 MED ORDER — CETIRIZINE HCL 10 MG PO TABS: 10.0000 mg | ORAL_TABLET | Freq: Every day | ORAL | 12 refills | Status: DC

## 2017-10-12 MED ORDER — VITAMIN D (ERGOCALCIFEROL) 1.25 MG (50000 UNIT) PO CAPS: 50000.0000 [IU] | ORAL_CAPSULE | ORAL | 2 refills | Status: DC

## 2017-10-12 MED ORDER — TRIAMCINOLONE ACETONIDE 0.5 % EX OINT: 1.0000 "application " | TOPICAL_OINTMENT | Freq: Two times a day (BID) | CUTANEOUS | 0 refills | Status: DC

## 2017-10-12 NOTE — Progress Notes (Signed)
C/o Eczema flaring up and needs allergy meds.

## 2017-10-12 NOTE — Patient Instructions (Addendum)
Allergies, Adult An allergy is when your body's defense system (immune system) overreacts to an otherwise harmless substance (allergen) that you breathe in or eat or something that touches your skin. When you come into contact with something that you are allergic to, your immune system produces certain proteins (antibodies). These proteins cause cells to release chemicals (histamines) that trigger the symptoms of an allergic reaction. Allergies often affect the nasal passages (allergic rhinitis), eyes (allergic conjunctivitis), skin (atopic dermatitis), and stomach. Allergies can be mild or severe. Allergies cannot spread from person to person (are not contagious). They can develop at any age and may be outgrown. What increases the risk? You may be at greater risk of allergies if other people in your family have allergies. What are the signs or symptoms? Symptoms depend on what type of allergy you have. They may include:  Runny, stuffy nose.  Sneezing.  Itchy mouth, ears, or throat.  Postnasal drip.  Sore throat.  Itchy, red, watery, or puffy eyes.  Skin rash or hives.  Stomach pain.  Vomiting.  Diarrhea.  Bloating.  Wheezing or coughing.  People with a severe allergy to food, medicine, or an insect bite may have a life-threatening allergic reaction (anaphylaxis). Symptoms of anaphylaxis include:  Hives.  Itching.  Flushed face.  Swollen lips, tongue, or mouth.  Tight or swollen throat.  Chest pain or tightness in the chest.  Trouble breathing or shortness of breath.  Rapid heartbeat.  Dizziness or fainting.  Vomiting.  Diarrhea.  Pain in the abdomen.  How is this diagnosed? This condition is diagnosed based on:  Your symptoms.  Your family and medical history.  A physical exam.  You may need to see a health care provider who specializes in treating allergies (allergist). You may also have tests, including:  Skin tests to see which allergens are  causing your symptoms, such as: ? Skin prick test. In this test, your skin is pricked with a tiny needle and exposed to small amounts of possible allergens to see if your skin reacts. ? Intradermal skin test. In this test, a small amount of allergen is injected under your skin to see if your skin reacts. ? Patch test. In this test, a small amount of allergen is placed on your skin and then your skin is covered with a bandage. Your health care provider will check your skin after a couple of days to see if a rash has developed.  Blood tests.  Challenges tests. In this test, you inhale a small amount of allergen by mouth to see if you have an allergic reaction.  You may also be asked to:  Keep a food diary. A food diary is a record of all the foods and drinks you have in a day and any symptoms you experience.  Practice an elimination diet. An elimination diet involves eliminating specific foods from your diet and then adding them back in one by one to find out if a certain food causes an allergic reaction.  How is this treated? Treatment for allergies depends on your symptoms. Treatment may include:  Cold compresses to soothe itching and swelling.  Eye drops.  Nasal sprays.  Using a saline spray or container (neti pot) to flush out the nose (nasal irrigation). These methods can help clear away mucus and keep the nasal passages moist.  Using a humidifier.  Oral antihistamines or other medicines to block allergic reaction and inflammation.  Skin creams to treat rashes or itching.  Diet changes to   eliminate food allergy triggers.  Repeated exposure to tiny amounts of allergens to build up a tolerance and prevent future allergic reactions (immunotherapy). These include: ? Allergy shots. ? Oral treatment. This involves taking small doses of an allergen under the tongue (sublingual immunotherapy).  Emergency epinephrine injection (auto-injector) in case of an allergic emergency. This is  a self-injectable, pre-measured medicine that must be given within the first few minutes of a serious allergic reaction.  Follow these instructions at home:  Avoid known allergens whenever possible.  If you suffer from airborne allergens, wash out your nose daily. You can do this with a saline spray or a neti pot to flush out your nose (nasal irrigation).  Take over-the-counter and prescription medicines only as told by your health care provider.  Keep all follow-up visits as told by your health care provider. This is important.  If you are at risk of a severe allergic reaction (anaphylaxis), keep your auto-injector with you at all times.  If you have ever had anaphylaxis, wear a medical alert bracelet or necklace that states you have a severe allergy. Contact a health care provider if:  Your symptoms do not improve with treatment. Get help right away if:  You have symptoms of anaphylaxis, such as: ? Swollen mouth, tongue, or throat. ? Pain or tightness in your chest. ? Trouble breathing or shortness of breath. ? Dizziness or fainting. ? Severe abdominal pain, vomiting, or diarrhea. This information is not intended to replace advice given to you by your health care provider. Make sure you discuss any questions you have with your health care provider. Document Released: 10/14/2002 Document Revised: 11/19/2016 Document Reviewed: 02/06/2016 Elsevier Interactive Patient Education  2018 [employer].  Rash A rash is a change in the color of the skin. A rash can also change the way your skin feels. There are many different conditions and factors that can cause a rash. Follow these instructions at home: Pay attention to any changes in your symptoms. Follow these instructions to help with your condition: Medicine Take or apply over-the-counter and prescription medicines only as told by your doctor. These may include:  Corticosteroid cream.  Anti-itch lotions.  Oral  antihistamines.  Skin Care  Put cool compresses on the affected areas.  Try taking a bath with: ? Epsom salts. Follow the instructions on the packaging. You can get these at your local pharmacy or grocery store. ? Baking soda. Pour a small amount into the bath as told by your doctor. ? Colloidal oatmeal. Follow the instructions on the packaging. You can get this at your local pharmacy or grocery store.  Try putting baking soda paste onto your skin. Stir water into baking soda until it gets like a paste.  Do not scratch or rub your skin.  Avoid covering the rash. Make sure the rash is exposed to air as much as possible. General instructions  Avoid hot showers or baths, which can make itching worse. A cold shower may help.  Avoid scented soaps, detergents, and perfumes. Use gentle soaps, detergents, perfumes, and other cosmetic products.  Avoid anything that causes your rash. Keep a journal to help track what causes your rash. Write down: ? What you eat. ? What cosmetic products you use. ? What you drink. ? What you wear. This includes jewelry.  Keep all follow-up visits as told by your doctor. This is important. Contact a doctor if:  You sweat at night.  You lose weight.  You pee (urinate) more than normal.  You feel weak.  You throw up (vomit).  Your skin or the whites of your eyes look yellow (jaundice).  Your skin: ? Tingles. ? Is numb.  Your rash: ? Does not go away after a few days. ? Gets worse.  You are: ? More thirsty than normal. ? More tired than normal.  You have: ? New symptoms. ? Pain in your belly (abdomen). ? A fever. ? Watery poop (diarrhea). Get help right away if:  Your rash covers all or most of your body. The rash may or may not be painful.  You have blisters that: ? Are on top of the rash. ? Grow larger. ? Grow together. ? Are painful. ? Are inside your nose or mouth.  You have a rash that: ? Looks like purple pinprick-sized  spots all over your body. ? Has a "bull's eye" or looks like a target. ? Is red and painful, causes your skin to peel, and is not from being in the sun too long. This information is not intended to replace advice given to you by your health care provider. Make sure you discuss any questions you have with your health care provider. Document Released: 01/07/2008 Document Revised: 12/27/2015 Document Reviewed: 12/06/2014 Elsevier Interactive Patient Education  2018 [employer]. Skin Conditions During Pregnancy Pregnancy affects many parts of your body. One part is your skin. Most skin problems that develop during pregnancy are not serious and are considered a normal part of pregnancy. They go away on their own after the baby is born. Other skin problems may need treatment. What type of skin problems can develop during pregnancy?  Stretch marks. Stretch marks are purple or pink lines on the skin. They may appear on the belly, breasts, thighs, or buttocks. Stretch marks are caused by weight gain that causes the skin to stretch. Stretch marks do not cause problems. Almost all women get them during pregnancy.  Darkening of the skin (hyperpigmentation). The darkening may occur in patches or as a line. Patches may appear on the face, nipples, or genital area. Lines often stretch from the belly button to the pubic area. Hyperpigmentation develops in almost all pregnant women. It is more severe in women with a dark complexion.  Spider angiomas. These are tiny pink or red lines that go out from a center point, like the legs of a spider. Usually, they are on the face, neck, and arms. They do not cause problems. They are most common in women with light complexions.  Palmar erythema. This is a reddening of the palms. It is most common in women with light complexions.  Swelling and redness. This can occur on the face, eyelids, fingers, or toes.  Pruritic urticarial papules and plaques of pregnancy (PUPPP).  This is a rash that is itchy, red, and has tiny blisters. The cause is unknown. It usually starts on the abdomen and may affect the arms or legs. It does not affect the face. It usually begins later in pregnancy. About a third of all pregnant women develop this condition. There are no associated problems to the fetus with this rash. Sometimes, oral steroids are used to calm down the itch. The rash clears after the baby is born.  Prurigo of pregnancy. This is a disease in which red patches and bumps appear on the arms and legs. The cause is unknown. The patches and bumps clear after the baby is born. About a third of pregnant women develop this disease.  Acne. Pimples may develop, including  in women who have had clear skin for a long time.  Skin tags. These are small flaps of skin that stick out from the body. They may grow or become darker during pregnancy. They are usually harmless.  Moles. These are flat or slightly raised growths. They are usually round and pink or brown. They may grow or become darker during pregnancy.  Intrahepatic cholestasis of pregnancy. This is a rare condition that causes itchy skin. It may run in families. It increases the risk of complications for the fetus. This condition usually resolves after delivery. It can recur with subsequent pregnancies.  Impetigo herpetiformis. This is a form of a severe skin disease called pustular psoriasis. Usually, delivery is the only method of resolving the condition.  Pruritic folliculitis of pregnancy. This is a rare condition that causes pimple-like skin growths. It develops in the middle or later stages of pregnancy. Its cause is unknown.It usually resolves 2-3 weeks after delivery.  Pemphigoid gestationis. This is a very rare autoimmune disease. It causes a severely itchy rash and blisters. The rash does not appear on the face, scalp, or inside of the mouth. It usually resolves 3 months after delivery. It may recur with subsequent  pregnancies. Some pre-existing skin conditions, such as atopic dermatitis, may become worse during pregnancy. Follow these instructions at home: Different conditions may have different instructions. In general:  Follow all your health care provider's directions about medicines to treat skin problems while you are pregnant. Do not use any over-the-counter medicines (including medicated creams and lotions) until you have checked with your health care provider. Many medicines are not safe to use when you are pregnant.  Avoid time in the sun. This will help keep your skin from darkening. When you must be outside, use sunscreen and wear a hat with a wide brim to protect your face. The sunscreen should have a SPF of at least 15. This may help limit dark spots that develop when the skin is exposed to the sun.  To avoid problems from stretched skin: ? Do not sit or stand for long periods of time. ? Exercise regularly. This helps keep your skin in good condition.  Use a gentle soap. This helps prevent acne.  Do not get too hot or too sweaty. This makes some skin rashes worse.  Wear loose clothes made of a soft fabric. This prevents skin irritation.  For itching, add oatmeal or cornstarch to your bathwater.  Use a skin moisturizer. Ask your health care provider for suggestions.  This information is not intended to replace advice given to you by your health care provider. Make sure you discuss any questions you have with your health care provider. Document Released: 08/23/2010 Document Revised: 12/27/2015 Document Reviewed: 05/02/2013 Elsevier Interactive Patient Education  2018 [employer]  Vitamin D Deficiency Vitamin D deficiency is when your body does not have enough vitamin D. Vitamin D is important because:  It helps your body use other minerals that your body needs.  It helps keep your bones strong and healthy.  It may help to prevent some diseases.  It helps your heart and other  muscles work well.  You can get vitamin D by:  Eating foods with vitamin D in them.  Drinking or eating milk or other foods that have had vitamin D added to them.  Taking a vitamin D supplement.  Being in the sun.  Not getting enough vitamin D can make your bones become soft. It can also cause other health  problems. Follow these instructions at home:  Take medicines and supplements only as told by your doctor.  Eat foods that have vitamin D. These include: ? Dairy products, cereals, or juices with added vitamin D. Check the label for vitamin D. ? Fatty fish like salmon or trout. ? Eggs. ? Oysters.  Do not use tanning beds.  Stay at a healthy weight. Lose weight, if needed.  Keep all follow-up visits as told by your doctor. This is important. Contact a doctor if:  Your symptoms do not go away.  You feel sick to your stomach (nauseous).  Youthrow up (vomit).  You poop less often than usual or you have trouble pooping (constipation). This information is not intended to replace advice given to you by your health care provider. Make sure you discuss any questions you have with your health care provider. Document Released: 07/10/2011 Document Revised: 12/27/2015 Document Reviewed: 12/06/2014 Elsevier Interactive Patient Education  2018 [employer].  Second Trimester of Pregnancy The second trimester is from week 14 through week 27 (months 4 through 6). The second trimester is often a time when you feel your best. Your body has adjusted to being pregnant, and you begin to feel better physically. Usually, morning sickness has lessened or quit completely, you may have more energy, and you may have an increase in appetite. The second trimester is also a time when the fetus is growing rapidly. At the end of the sixth month, the fetus is about 9 inches long and weighs about 1 pounds. You will likely begin to feel the baby move (quickening) between 16 and 20 weeks of pregnancy. Body  changes during your second trimester Your body continues to go through many changes during your second trimester. The changes vary from woman to woman.  Your weight will continue to increase. You will notice your lower abdomen bulging out.  You may begin to get stretch marks on your hips, abdomen, and breasts.  You may develop headaches that can be relieved by medicines. The medicines should be approved by your health care provider.  You may urinate more often because the fetus is pressing on your bladder.  You may develop or continue to have heartburn as a result of your pregnancy.  You may develop constipation because certain hormones are causing the muscles that push waste through your intestines to slow down.  You may develop hemorrhoids or swollen, bulging veins (varicose veins).  You may have back pain. This is caused by: ? Weight gain. ? Pregnancy hormones that are relaxing the joints in your pelvis. ? A shift in weight and the muscles that support your balance.  Your breasts will continue to grow and they will continue to become tender.  Your gums may bleed and may be sensitive to brushing and flossing.  Dark spots or blotches (chloasma, mask of pregnancy) may develop on your face. This will likely fade after the baby is born.  A dark line from your belly button to the pubic area (linea nigra) may appear. This will likely fade after the baby is born.  You may have changes in your hair. These can include thickening of your hair, rapid growth, and changes in texture. Some women also have hair loss during or after pregnancy, or hair that feels dry or thin. Your hair will most likely return to normal after your baby is born.  What to expect at prenatal visits During a routine prenatal visit:  You will be weighed to make sure you and  the fetus are growing normally.  Your blood pressure will be taken.  Your abdomen will be measured to track your baby's growth.  The fetal  heartbeat will be listened to.  Any test results from the previous visit will be discussed.  Your health care provider may ask you:  How you are feeling.  If you are feeling the baby move.  If you have had any abnormal symptoms, such as leaking fluid, bleeding, severe headaches, or abdominal cramping.  If you are using any tobacco products, including cigarettes, chewing tobacco, and electronic cigarettes.  If you have any questions.  Other tests that may be performed during your second trimester include:  Blood tests that check for: ? Low iron levels (anemia). ? High blood sugar that affects pregnant women (gestational diabetes) between 106 and 28 weeks. ? Rh antibodies. This is to check for a protein on red blood cells (Rh factor).  Urine tests to check for infections, diabetes, or protein in the urine.  An ultrasound to confirm the proper growth and development of the baby.  An amniocentesis to check for possible genetic problems.  Fetal screens for spina bifida and Down syndrome.  HIV (human immunodeficiency virus) testing. Routine prenatal testing includes screening for HIV, unless you choose not to have this test.  Follow these instructions at home: Medicines  Follow your health care provider's instructions regarding medicine use. Specific medicines may be either safe or unsafe to take during pregnancy.  Take a prenatal vitamin that contains at least 600 micrograms (mcg) of folic acid.  If you develop constipation, try taking a stool softener if your health care provider approves. Eating and drinking  Eat a balanced diet that includes fresh fruits and vegetables, whole grains, good sources of protein such as meat, eggs, or tofu, and low-fat dairy. Your health care provider will help you determine the amount of weight gain that is right for you.  Avoid raw meat and uncooked cheese. These carry germs that can cause birth defects in the baby.  If you have low calcium  intake from food, talk to your health care provider about whether you should take a daily calcium supplement.  Limit foods that are high in fat and processed sugars, such as fried and sweet foods.  To prevent constipation: ? Drink enough fluid to keep your urine clear or pale yellow. ? Eat foods that are high in fiber, such as fresh fruits and vegetables, whole grains, and beans. Activity  Exercise only as directed by your health care provider. Most women can continue their usual exercise routine during pregnancy. Try to exercise for 30 minutes at least 5 days a week. Stop exercising if you experience uterine contractions.  Avoid heavy lifting, wear low heel shoes, and practice good posture.  A sexual relationship may be continued unless your health care provider directs you otherwise. Relieving pain and discomfort  Wear a good support bra to prevent discomfort from breast tenderness.  Take warm sitz baths to soothe any pain or discomfort caused by hemorrhoids. Use hemorrhoid cream if your health care provider approves.  Rest with your legs elevated if you have leg cramps or low back pain.  If you develop varicose veins, wear support hose. Elevate your feet for 15 minutes, 3-4 times a day. Limit salt in your diet. Prenatal Care  Write down your questions. Take them to your prenatal visits.  Keep all your prenatal visits as told by your health care provider. This is important. Safety  Wear  your seat belt at all times when driving.  Make a list of emergency phone numbers, including numbers for family, friends, the hospital, and police and fire departments. General instructions  Ask your health care provider for a referral to a local prenatal education class. Begin classes no later than the beginning of month 6 of your pregnancy.  Ask for help if you have counseling or nutritional needs during pregnancy. Your health care provider can offer advice or refer you to specialists for help  with various needs.  Do not use hot tubs, steam rooms, or saunas.  Do not douche or use tampons or scented sanitary pads.  Do not cross your legs for long periods of time.  Avoid cat litter boxes and soil used by cats. These carry germs that can cause birth defects in the baby and possibly loss of the fetus by miscarriage or stillbirth.  Avoid all smoking, herbs, alcohol, and unprescribed drugs. Chemicals in these products can affect the formation and growth of the baby.  Do not use any products that contain nicotine or tobacco, such as cigarettes and e-cigarettes. If you need help quitting, ask your health care provider.  Visit your dentist if you have not gone yet during your pregnancy. Use a soft toothbrush to brush your teeth and be gentle when you floss. Contact a health care provider if:  You have dizziness.  You have mild pelvic cramps, pelvic pressure, or nagging pain in the abdominal area.  You have persistent nausea, vomiting, or diarrhea.  You have a bad smelling vaginal discharge.  You have pain when you urinate. Get help right away if:  You have a fever.  You are leaking fluid from your vagina.  You have spotting or bleeding from your vagina.  You have severe abdominal cramping or pain.  You have rapid weight gain or weight loss.  You have shortness of breath with chest pain.  You notice sudden or extreme swelling of your face, hands, ankles, feet, or legs.  You have not felt your baby move in over an hour.  You have severe headaches that do not go away when you take medicine.  You have vision changes. Summary  The second trimester is from week 14 through week 27 (months 4 through 6). It is also a time when the fetus is growing rapidly.  Your body goes through many changes during pregnancy. The changes vary from woman to woman.  Avoid all smoking, herbs, alcohol, and unprescribed drugs. These chemicals affect the formation and growth your baby.  Do  not use any tobacco products, such as cigarettes, chewing tobacco, and e-cigarettes. If you need help quitting, ask your health care provider.  Contact your health care provider if you have any questions. Keep all prenatal visits as told by your health care provider. This is important. This information is not intended to replace advice given to you by your health care provider. Make sure you discuss any questions you have with your health care provider. Document Released: 07/15/2001 Document Revised: 08/26/2016 Document Reviewed: 08/26/2016 Elsevier Interactive Patient Education  [employer].

## 2017-10-12 NOTE — Progress Notes (Signed)
   PRENATAL VISIT NOTE  Subjective:  Melanie Calderon is a 27 y.o. [phone] at 3988w2d being seen today for ongoing prenatal care.  She is currently monitored for the following issues for this low-risk pregnancy and has Family history of colon cancer; Allergic rhinoconjunctivitis; Perioral dermatitis; Follow-up visit after miscarriage; Supervision of other normal pregnancy, antepartum; History of C-section; Vitamin D deficiency; Anemia during pregnancy in second trimester; and Eczema on their problem list.  Patient reports no bleeding, no contractions, no cramping, no leaking and allergy symptoms is taking 1 benadryl tablet at night, eczema type rash on arms legs.  Contractions: Not present. Vag. Bleeding: None.  Movement: Present. Denies leaking of fluid.   The following portions of the patient's history were reviewed and updated as appropriate: allergies, current medications, past family history, past medical history, past social history, past surgical history and problem list. Problem list updated.  Objective:   Vitals:   10/12/17 0836  BP: 113/75  Pulse: 88  Weight: 142 lb 12.8 oz (64.8 kg)    Fetal Status: Fetal Heart Rate (bpm): 155; doppler Fundal Height: 20 cm Movement: Present     General:  Alert, oriented and cooperative. Patient is in no acute distress.  Skin: Skin is warm and dry. No rash noted.   Cardiovascular: Normal heart rate noted  Respiratory: Normal respiratory effort, no problems with respiration noted  Abdomen: Soft, gravid, appropriate for gestational age.  Pain/Pressure: Absent     Pelvic: Cervical exam deferred        Extremities: Normal range of motion.  Edema: None  Mental Status:  Normal mood and affect. Normal behavior. Normal judgment and thought content.   Assessment and Plan:  Pregnancy: [id number] at 7388w2d  1. History of C-section     Repeat C-section planned, 2 previous C-sections  2. Supervision of other normal pregnancy, antepartum       3.  Vitamin D deficiency      Taking weekly vitamin D - Vitamin D, Ergocalciferol, (DRISDOL) 50000 units CAPS capsule; Take 1 capsule (50,000 Units total) by mouth every 7 (seven) days.  Dispense: 30 capsule; Refill: 2  4. Anemia during pregnancy in second trimester     Taking Bloom  5. Other eczema    ?eczema versus dermatitis - triamcinolone ointment (KENALOG) 0.5 %; Apply 1 application topically 2 (two) times daily.  Dispense: 30 g; Refill: 0  6. Allergic state, initial encounter     - cetirizine (ZYRTEC) 10 MG tablet; Take 1 tablet (10 mg total) by mouth daily.  Dispense: 30 tablet; Refill: 12  Preterm labor symptoms and general obstetric precautions including but not limited to vaginal bleeding, contractions, leaking of fluid and fetal movement were reviewed in detail with the patient. Please refer to After Visit Summary for other counseling recommendations.  Return in about 4 weeks (around 11/09/2017) for ROB.   Roe Coombsachelle A Sharifah Champine, CNM.

## 2017-11-16 ENCOUNTER — Encounter: Payer: Self-pay | Admitting: Certified Nurse Midwife

## 2017-11-16 ENCOUNTER — Ambulatory Visit (INDEPENDENT_AMBULATORY_CARE_PROVIDER_SITE_OTHER): Payer: Medicaid Other | Admitting: Certified Nurse Midwife

## 2017-11-16 VITALS — BP 125/86 | HR 71 | Wt 147.0 lb

## 2017-11-16 DIAGNOSIS — Z3480 Encounter for supervision of other normal pregnancy, unspecified trimester: Secondary | ICD-10-CM

## 2017-11-16 DIAGNOSIS — Z3482 Encounter for supervision of other normal pregnancy, second trimester: Secondary | ICD-10-CM

## 2017-11-16 DIAGNOSIS — E559 Vitamin D deficiency, unspecified: Secondary | ICD-10-CM

## 2017-11-16 NOTE — Progress Notes (Signed)
   PRENATAL VISIT NOTE  Subjective:  Melanie Calderon is a 27 y.o. [phone] [id number] at 7517w2d being seen today for ongoing prenatal care.  She is currently monitored for the following issues for this low-risk pregnancy and has Family history of colon cancer; Allergic rhinoconjunctivitis; Perioral dermatitis; Follow-up visit after miscarriage; Supervision of other normal pregnancy, antepartum; History of C-section; Vitamin D deficiency; Anemia during pregnancy in second trimester; and Eczema on their problem list.  Patient reports no complaints.  Contractions: Not present. Vag. Bleeding: None.  Movement: Present. Denies leaking of fluid.   The following portions of the patient's history were reviewed and updated as appropriate: allergies, current medications, past family history, past medical history, past social history, past surgical history and problem list. Problem list updated.  Objective:   Vitals:   11/16/17 0813  BP: 125/86  Pulse: 71  Weight: 147 lb (66.7 kg)    Fetal Status: Fetal Heart Rate (bpm): 152; doppler Fundal Height: 25 cm Movement: Present     General:  Alert, oriented and cooperative. Patient is in no acute distress.  Skin: Skin is warm and dry. No rash noted.   Cardiovascular: Normal heart rate noted  Respiratory: Normal respiratory effort, no problems with respiration noted  Abdomen: Soft, gravid, appropriate for gestational age.  Pain/Pressure: Absent     Pelvic: Cervical exam deferred        Extremities: Normal range of motion.  Edema: None  Mental Status: Normal mood and affect. Normal behavior. Normal judgment and thought content.   Assessment and Plan:  Pregnancy: [id number] at 4017w2d  1. Supervision of other normal pregnancy, antepartum     Doing well.    2. Vitamin D deficiency     Taking weekly vitamin D  Preterm labor symptoms and general obstetric precautions including but not limited to vaginal bleeding, contractions, leaking of fluid and fetal movement  were reviewed in detail with the patient. Please refer to After Visit Summary for other counseling recommendations.  Return in about 1 month (around 12/14/2017) for ROB, 2 hr OGTT.  No future appointments.  Roe Coombsachelle A Matyas Baisley, CNM

## 2017-11-16 NOTE — Patient Instructions (Signed)
Braxton Hicks Contractions Contractions of the uterus can occur throughout pregnancy, but they are not always a sign that you are in labor. You may have practice contractions called Braxton Hicks contractions. These false labor contractions are sometimes confused with true labor. What are Braxton Hicks contractions? Braxton Hicks contractions are tightening movements that occur in the muscles of the uterus before labor. Unlike true labor contractions, these contractions do not result in opening (dilation) and thinning of the cervix. Toward the end of pregnancy (32-34 weeks), Braxton Hicks contractions can happen more often and may become stronger. These contractions are sometimes difficult to tell apart from true labor because they can be very uncomfortable. You should not feel embarrassed if you go to the hospital with false labor. Sometimes, the only way to tell if you are in true labor is for your health care provider to look for changes in the cervix. The health care provider will do a physical exam and may monitor your contractions. If you are not in true labor, the exam should show that your cervix is not dilating and your water has not broken. If there are other health problems associated with your pregnancy, it is completely safe for you to be sent home with false labor. You may continue to have Braxton Hicks contractions until you go into true labor. How to tell the difference between true labor and false labor True labor  Contractions last 30-70 seconds.  Contractions become very regular.  Discomfort is usually felt in the top of the uterus, and it spreads to the lower abdomen and low back.  Contractions do not go away with walking.  Contractions usually become more intense and increase in frequency.  The cervix dilates and gets thinner. False labor  Contractions are usually shorter and not as strong as true labor contractions.  Contractions are usually irregular.  Contractions  are often felt in the front of the lower abdomen and in the groin.  Contractions may go away when you walk around or change positions while lying down.  Contractions get weaker and are shorter-lasting as time goes on.  The cervix usually does not dilate or become thin. Follow these instructions at home:  Take over-the-counter and prescription medicines only as told by your health care provider.  Keep up with your usual exercises and follow other instructions from your health care provider.  Eat and drink lightly if you think you are going into labor.  If Braxton Hicks contractions are making you uncomfortable: ? Change your position from lying down or resting to walking, or change from walking to resting. ? Sit and rest in a tub of warm water. ? Drink enough fluid to keep your urine pale yellow. Dehydration may cause these contractions. ? Do slow and deep breathing several times an hour.  Keep all follow-up prenatal visits as told by your health care provider. This is important. Contact a health care provider if:  You have a fever.  You have continuous pain in your abdomen. Get help right away if:  Your contractions become stronger, more regular, and closer together.  You have fluid leaking or gushing from your vagina.  You pass blood-tinged mucus (bloody show).  You have bleeding from your vagina.  You have low back pain that you never had before.  You feel your baby's head pushing down and causing pelvic pressure.  Your baby is not moving inside you as much as it used to. Summary  Contractions that occur before labor are called Braxton   Hicks contractions, false labor, or practice contractions.  Braxton Hicks contractions are usually shorter, weaker, farther apart, and less regular than true labor contractions. True labor contractions usually become progressively stronger and regular and they become more frequent.  Manage discomfort from [facility] contractions by  changing position, resting in a warm bath, drinking plenty of water, or practicing deep breathing. This information is not intended to replace advice given to you by your health care provider. Make sure you discuss any questions you have with your health care provider. Document Released: 12/04/2016 Document Revised: 12/04/2016 Document Reviewed: 12/04/2016 Elsevier Interactive Patient Education  2018 [employer]. Glucose Tolerance Test During Pregnancy The glucose tolerance test (GTT) is a blood test used to determine if you have developed a type of diabetes during pregnancy (gestational diabetes). This is when your body does not properly process sugar (glucose) in the food you eat, resulting in high blood glucose levels. Typically, a GTT is done after you have had a 1-hour glucose test with results that indicate you possibly have gestational diabetes. It may also be done if:  You have a history of giving birth to very large babies or have experienced repeated fetal loss (stillbirth).  You have signs and symptoms of diabetes, such as: ? Changes in your vision. ? Tingling or numbness in your hands or feet. ? Changes in hunger, thirst, and urination not otherwise explained by your pregnancy.  The GTT lasts about 3 hours. You will be given a sugar-water solution to drink at the beginning of the test. You will have blood drawn before you drink the solution and then again 1, 2, and 3 hours after you drink it. You will not be allowed to eat or drink anything else during the test. You must remain at the testing location to make sure that your blood is drawn on time. You should also avoid exercising during the test, because exercise can alter test results. How do I prepare for this test? Eat normally for 3 days prior to the GTT test, including having plenty of carbohydrate-rich foods. Do not eat or drink anything except water during the final 12 hours before the test. In addition, your health care provider  may ask you to stop taking certain medicines before the test. What do the results mean? It is your responsibility to obtain your test results. Ask the lab or department performing the test when and how you will get your results. Contact your health care provider to discuss any questions you have about your results. Range of Normal Values Ranges for normal values may vary among different labs and hospitals. You should always check with your health care provider after having lab work or other tests done to discuss whether your values are considered within normal limits. Normal levels of blood glucose are as follows:  Fasting: less than 105 mg/dL.  1 hour after drinking the solution: less than 190 mg/dL.  2 hours after drinking the solution: less than 165 mg/dL.  3 hours after drinking the solution: less than 145 mg/dL.  Some substances can interfere with GTT results. These may include:  Blood pressure and heart failure medicines, including beta blockers, furosemide, and thiazides.  Anti-inflammatory medicines, including aspirin.  Nicotine.  Some psychiatric medicines.  Meaning of Results Outside Normal Value Ranges GTT test results that are above normal values may indicate a number of health problems, such as:  Gestational diabetes.  Acute stress response.  Cushing syndrome.  Tumors such as pheochromocytoma or glucagonoma.  Long-term kidney problems.  Pancreatitis.  Hyperthyroidism.  Current infection.  Discuss your test results with your health care provider. He or she will use the results to make a diagnosis and determine a treatment plan that is right for you. This information is not intended to replace advice given to you by your health care provider. Make sure you discuss any questions you have with your health care provider. Document Released: 01/20/2012 Document Revised: 12/27/2015 Document Reviewed: 11/25/2013 Elsevier Interactive Patient Education  2018 [employer]  Second Trimester of Pregnancy The second trimester is from week 13 through week 28, month 4 through 6. This is often the time in pregnancy that you feel your best. Often times, morning sickness has lessened or quit. You may have more energy, and you may get hungry more often. Your unborn baby (fetus) is growing rapidly. At the end of the sixth month, he or she is about 9 inches long and weighs about 1 pounds. You will likely feel the baby move (quickening) between 18 and 20 weeks of pregnancy. Follow these instructions at home:  Avoid all smoking, herbs, and alcohol. Avoid drugs not approved by your doctor.  Do not use any tobacco products, including cigarettes, chewing tobacco, and electronic cigarettes. If you need help quitting, ask your doctor. You may get counseling or other support to help you quit.  Only take medicine as told by your doctor. Some medicines are safe and some are not during pregnancy.  Exercise only as told by your doctor. Stop exercising if you start having cramps.  Eat regular, healthy meals.  Wear a good support bra if your breasts are tender.  Do not use hot tubs, steam rooms, or saunas.  Wear your seat belt when driving.  Avoid raw meat, uncooked cheese, and liter boxes and soil used by cats.  Take your prenatal vitamins.  Take 1500-2000 milligrams of calcium daily starting at the 20th week of pregnancy until you deliver your baby.  Try taking medicine that helps you poop (stool softener) as needed, and if your doctor approves. Eat more fiber by eating fresh fruit, vegetables, and whole grains. Drink enough fluids to keep your pee (urine) clear or pale yellow.  Take warm water baths (sitz baths) to soothe pain or discomfort caused by hemorrhoids. Use hemorrhoid cream if your doctor approves.  If you have puffy, bulging veins (varicose veins), wear support hose. Raise (elevate) your feet for 15 minutes, 3-4 times a day. Limit salt in your  diet.  Avoid heavy lifting, wear low heals, and sit up straight.  Rest with your legs raised if you have leg cramps or low back pain.  Visit your dentist if you have not gone during your pregnancy. Use a soft toothbrush to brush your teeth. Be gentle when you floss.  You can have sex (intercourse) unless your doctor tells you not to.  Go to your doctor visits. Get help if:  You feel dizzy.  You have mild cramps or pressure in your lower belly (abdomen).  You have a nagging pain in your belly area.  You continue to feel sick to your stomach (nauseous), throw up (vomit), or have watery poop (diarrhea).  You have bad smelling fluid coming from your vagina.  You have pain with peeing (urination). Get help right away if:  You have a fever.  You are leaking fluid from your vagina.  You have spotting or bleeding from your vagina.  You have severe belly cramping or pain.  You  lose or gain weight rapidly.  You have trouble catching your breath and have chest pain.  You notice sudden or extreme puffiness (swelling) of your face, hands, ankles, feet, or legs.  You have not felt the baby move in over an hour.  You have severe headaches that do not go away with medicine.  You have vision changes. This information is not intended to replace advice given to you by your health care provider. Make sure you discuss any questions you have with your health care provider. Document Released: 10/15/2009 Document Revised: 12/27/2015 Document Reviewed: 09/21/2012 Elsevier Interactive Patient Education  2017 [employer].

## 2017-12-07 ENCOUNTER — Encounter (HOSPITAL_COMMUNITY): Payer: Self-pay

## 2017-12-14 ENCOUNTER — Other Ambulatory Visit: Payer: Medicaid Other

## 2017-12-14 ENCOUNTER — Ambulatory Visit (INDEPENDENT_AMBULATORY_CARE_PROVIDER_SITE_OTHER): Payer: Medicaid Other | Admitting: Certified Nurse Midwife

## 2017-12-14 VITALS — BP 121/84 | HR 69 | Wt 150.2 lb

## 2017-12-14 DIAGNOSIS — Z23 Encounter for immunization: Secondary | ICD-10-CM | POA: Diagnosis not present

## 2017-12-14 DIAGNOSIS — Z3483 Encounter for supervision of other normal pregnancy, third trimester: Secondary | ICD-10-CM

## 2017-12-14 DIAGNOSIS — Z3480 Encounter for supervision of other normal pregnancy, unspecified trimester: Secondary | ICD-10-CM

## 2017-12-14 DIAGNOSIS — D649 Anemia, unspecified: Secondary | ICD-10-CM

## 2017-12-14 DIAGNOSIS — O99012 Anemia complicating pregnancy, second trimester: Secondary | ICD-10-CM

## 2017-12-14 DIAGNOSIS — O99013 Anemia complicating pregnancy, third trimester: Secondary | ICD-10-CM

## 2017-12-14 DIAGNOSIS — Z98891 History of uterine scar from previous surgery: Secondary | ICD-10-CM

## 2017-12-14 DIAGNOSIS — E559 Vitamin D deficiency, unspecified: Secondary | ICD-10-CM

## 2017-12-14 NOTE — Patient Instructions (Signed)
Third Trimester of Pregnancy The third trimester is from week 28 through week 40 (months 7 through 9). The third trimester is a time when the unborn baby (fetus) is growing rapidly. At the end of the ninth month, the fetus is about 20 inches in length and weighs 6-10 pounds. Body changes during your third trimester Your body will continue to go through many changes during pregnancy. The changes vary from woman to woman. During the third trimester:  Your weight will continue to increase. You can expect to gain 25-35 pounds (11-16 kg) by the end of the pregnancy.  You may begin to get stretch marks on your hips, abdomen, and breasts.  You may urinate more often because the fetus is moving lower into your pelvis and pressing on your bladder.  You may develop or continue to have heartburn. This is caused by increased hormones that slow down muscles in the digestive tract.  You may develop or continue to have constipation because increased hormones slow digestion and cause the muscles that push waste through your intestines to relax.  You may develop hemorrhoids. These are swollen veins (varicose veins) in the rectum that can itch or be painful.  You may develop swollen, bulging veins (varicose veins) in your legs.  You may have increased body aches in the pelvis, back, or thighs. This is due to weight gain and increased hormones that are relaxing your joints.  You may have changes in your hair. These can include thickening of your hair, rapid growth, and changes in texture. Some women also have hair loss during or after pregnancy, or hair that feels dry or thin. Your hair will most likely return to normal after your baby is born.  Your breasts will continue to grow and they will continue to become tender. A yellow fluid (colostrum) may leak from your breasts. This is the first milk you are producing for your baby.  Your belly button may stick out.  You may notice more swelling in your hands,  face, or ankles.  You may have increased tingling or numbness in your hands, arms, and legs. The skin on your belly may also feel numb.  You may feel short of breath because of your expanding uterus.  You may have more problems sleeping. This can be caused by the size of your belly, increased need to urinate, and an increase in your body's metabolism.  You may notice the fetus "dropping," or moving lower in your abdomen (lightening).  You may have increased vaginal discharge.  You may notice your joints feel loose and you may have pain around your pelvic bone.  What to expect at prenatal visits You will have prenatal exams every 2 weeks until week 36. Then you will have weekly prenatal exams. During a routine prenatal visit:  You will be weighed to make sure you and the baby are growing normally.  Your blood pressure will be taken.  Your abdomen will be measured to track your baby's growth.  The fetal heartbeat will be listened to.  Any test results from the previous visit will be discussed.  You may have a cervical check near your due date to see if your cervix has softened or thinned (effaced).  You will be tested for Group B streptococcus. This happens between 35 and 37 weeks.  Your health care provider may ask you:  What your birth plan is.  How you are feeling.  If you are feeling the baby move.  If you have had   any abnormal symptoms, such as leaking fluid, bleeding, severe headaches, or abdominal cramping.  If you are using any tobacco products, including cigarettes, chewing tobacco, and electronic cigarettes.  If you have any questions.  Other tests or screenings that may be performed during your third trimester include:  Blood tests that check for low iron levels (anemia).  Fetal testing to check the health, activity level, and growth of the fetus. Testing is done if you have certain medical conditions or if there are problems during the  pregnancy.  Nonstress test (NST). This test checks the health of your baby to make sure there are no signs of problems, such as the baby not getting enough oxygen. During this test, a belt is placed around your belly. The baby is made to move, and its heart rate is monitored during movement.  What is false labor? False labor is a condition in which you feel small, irregular tightenings of the muscles in the womb (contractions) that usually go away with rest, changing position, or drinking water. These are called Braxton Hicks contractions. Contractions may last for hours, days, or even weeks before true labor sets in. If contractions come at regular intervals, become more frequent, increase in intensity, or become painful, you should see your health care provider. What are the signs of labor?  Abdominal cramps.  Regular contractions that start at 10 minutes apart and become stronger and more frequent with time.  Contractions that start on the top of the uterus and spread down to the lower abdomen and back.  Increased pelvic pressure and dull back pain.  A watery or bloody mucus discharge that comes from the vagina.  Leaking of amniotic fluid. This is also known as your "water breaking." It could be a slow trickle or a gush. Let your health care provider know if it has a color or strange odor. If you have any of these signs, call your health care provider right away, even if it is before your due date. Follow these instructions at home: Medicines  Follow your health care provider's instructions regarding medicine use. Specific medicines may be either safe or unsafe to take during pregnancy.  Take a prenatal vitamin that contains at least 600 micrograms (mcg) of folic acid.  If you develop constipation, try taking a stool softener if your health care provider approves. Eating and drinking  Eat a balanced diet that includes fresh fruits and vegetables, whole grains, good sources of protein  such as meat, eggs, or tofu, and low-fat dairy. Your health care provider will help you determine the amount of weight gain that is right for you.  Avoid raw meat and uncooked cheese. These carry germs that can cause birth defects in the baby.  If you have low calcium intake from food, talk to your health care provider about whether you should take a daily calcium supplement.  Eat four or five small meals rather than three large meals a day.  Limit foods that are high in fat and processed sugars, such as fried and sweet foods.  To prevent constipation: ? Drink enough fluid to keep your urine clear or pale yellow. ? Eat foods that are high in fiber, such as fresh fruits and vegetables, whole grains, and beans. Activity  Exercise only as directed by your health care provider. Most women can continue their usual exercise routine during pregnancy. Try to exercise for 30 minutes at least 5 days a week. Stop exercising if you experience uterine contractions.  Avoid heavy   lifting.  Do not exercise in extreme heat or humidity, or at high altitudes.  Wear low-heel, comfortable shoes.  Practice good posture.  You may continue to have sex unless your health care provider tells you otherwise. Relieving pain and discomfort  Take frequent breaks and rest with your legs elevated if you have leg cramps or low back pain.  Take warm sitz baths to soothe any pain or discomfort caused by hemorrhoids. Use hemorrhoid cream if your health care provider approves.  Wear a good support bra to prevent discomfort from breast tenderness.  If you develop varicose veins: ? Wear support pantyhose or compression stockings as told by your healthcare provider. ? Elevate your feet for 15 minutes, 3-4 times a day. Prenatal care  Write down your questions. Take them to your prenatal visits.  Keep all your prenatal visits as told by your health care provider. This is important. Safety  Wear your seat belt at  all times when driving.  Make a list of emergency phone numbers, including numbers for family, friends, the hospital, and police and fire departments. General instructions  Avoid cat litter boxes and soil used by cats. These carry germs that can cause birth defects in the baby. If you have a cat, ask someone to clean the litter box for you.  Do not travel far distances unless it is absolutely necessary and only with the approval of your health care provider.  Do not use hot tubs, steam rooms, or saunas.  Do not drink alcohol.  Do not use any products that contain nicotine or tobacco, such as cigarettes and e-cigarettes. If you need help quitting, ask your health care provider.  Do not use any medicinal herbs or unprescribed drugs. These chemicals affect the formation and growth of the baby.  Do not douche or use tampons or scented sanitary pads.  Do not cross your legs for long periods of time.  To prepare for the arrival of your baby: ? Take prenatal classes to understand, practice, and ask questions about labor and delivery. ? Make a trial run to the hospital. ? Visit the hospital and tour the maternity area. ? Arrange for maternity or paternity leave through employers. ? Arrange for family and friends to take care of pets while you are in the hospital. ? Purchase a rear-facing car seat and make sure you know how to install it in your car. ? Pack your hospital bag. ? Prepare the baby's nursery. Make sure to remove all pillows and stuffed animals from the baby's crib to prevent suffocation.  Visit your dentist if you have not gone during your pregnancy. Use a soft toothbrush to brush your teeth and be gentle when you floss. Contact a health care provider if:  You are unsure if you are in labor or if your water has broken.  You become dizzy.  You have mild pelvic cramps, pelvic pressure, or nagging pain in your abdominal area.  You have lower back pain.  You have persistent  nausea, vomiting, or diarrhea.  You have an unusual or bad smelling vaginal discharge.  You have pain when you urinate. Get help right away if:  Your water breaks before 37 weeks.  You have regular contractions less than 5 minutes apart before 37 weeks.  You have a fever.  You are leaking fluid from your vagina.  You have spotting or bleeding from your vagina.  You have severe abdominal pain or cramping.  You have rapid weight loss or weight gain.    You have shortness of breath with chest pain.  You notice sudden or extreme swelling of your face, hands, ankles, feet, or legs.  Your baby makes fewer than 10 movements in 2 hours.  You have severe headaches that do not go away when you take medicine.  You have vision changes. Summary  The third trimester is from week 28 through week 40, months 7 through 9. The third trimester is a time when the unborn baby (fetus) is growing rapidly.  During the third trimester, your discomfort may increase as you and your baby continue to gain weight. You may have abdominal, leg, and back pain, sleeping problems, and an increased need to urinate.  During the third trimester your breasts will keep growing and they will continue to become tender. A yellow fluid (colostrum) may leak from your breasts. This is the first milk you are producing for your baby.  False labor is a condition in which you feel small, irregular tightenings of the muscles in the womb (contractions) that eventually go away. These are called Braxton Hicks contractions. Contractions may last for hours, days, or even weeks before true labor sets in.  Signs of labor can include: abdominal cramps; regular contractions that start at 10 minutes apart and become stronger and more frequent with time; watery or bloody mucus discharge that comes from the vagina; increased pelvic pressure and dull back pain; and leaking of amniotic fluid. This information is not intended to replace advice  given to you by your health care provider. Make sure you discuss any questions you have with your health care provider. Document Released: 07/15/2001 Document Revised: 12/27/2015 Document Reviewed: 09/21/2012 Elsevier Interactive Patient Education  2017 [employer]  Before Baby Comes Home Before your baby arrives it is important to:  Have all of the supplies that you will need to care for your baby.  Know where to go if there is an emergency.  Discuss the baby's arrival with other family members.  What supplies will I need?  It is recommended that you have the following supplies: Large Items  Crib.  Crib mattress.  Rear-facing infant car seat. If possible, have a trained professional check to make sure that it is installed correctly.  Feeding  6-8 bottles that are 4-5 oz in size.  6-8 nipples.  Bottle brush.  Sterilizer, or a large pan or kettle with a lid.  A way to boil and cool water.  If you will be breastfeeding: ? Breast pump. ? Nipple cream. ? Nursing bra. ? Breast pads. ? Breast shields.  If you will be formula feeding: ? Formula. ? Measuring cups. ? Measuring spoons.  Bathing  Mild baby soap and baby shampoo.  Petroleum jelly.  Soft cloth towel and washcloth.  Hooded towel.  Cotton balls.  Bath basin.  Other Supplies  Rectal thermometer.  Bulb syringe.  Baby wipes or washcloths for diaper changes.  Diaper bag.  Changing pad.  Clothing, including one-piece outfits and pajamas.  Baby nail clippers.  Receiving blankets.  Mattress pad and sheets for the crib.  Night-light for the baby's room.  Baby monitor.  2 or 3 pacifiers.  Either 24-36 cloth diapers and waterproof diaper covers or a box of disposable diapers. You may need to use as many as 10-12 diapers per day.  How do I prepare for an emergency? Prepare for an emergency by:  Knowing how to get to the nearest hospital.  Listing the phone numbers of your baby's  health care providers   near your home phone and in your cell phone.  How do I prepare my family?  Decide how to handle visitors.  If you have other children: ? Talk with them about the baby coming home. Ask them how they feel about it. ? Read a book together about being a new big brother or sister. ? Find ways to let them help you prepare for the new baby. ? Have someone ready to care for them while you are in the hospital. This information is not intended to replace advice given to you by your health care provider. Make sure you discuss any questions you have with your health care provider. Document Released: 07/03/2008 Document Revised: 12/27/2015 Document Reviewed: 06/28/2014 Elsevier Interactive Patient Education  2018 [employer]  Braxton Hicks Contractions Contractions of the uterus can occur throughout pregnancy, but they are not always a sign that you are in labor. You may have practice contractions called Braxton Hicks contractions. These false labor contractions are sometimes confused with true labor. What are Braxton Hicks contractions? Braxton Hicks contractions are tightening movements that occur in the muscles of the uterus before labor. Unlike true labor contractions, these contractions do not result in opening (dilation) and thinning of the cervix. Toward the end of pregnancy (32-34 weeks), Braxton Hicks contractions can happen more often and may become stronger. These contractions are sometimes difficult to tell apart from true labor because they can be very uncomfortable. You should not feel embarrassed if you go to the hospital with false labor. Sometimes, the only way to tell if you are in true labor is for your health care provider to look for changes in the cervix. The health care provider will do a physical exam and may monitor your contractions. If you are not in true labor, the exam should show that your cervix is not dilating and your water has not broken. If there are  other health problems associated with your pregnancy, it is completely safe for you to be sent home with false labor. You may continue to have Braxton Hicks contractions until you go into true labor. How to tell the difference between true labor and false labor True labor  Contractions last 30-70 seconds.  Contractions become very regular.  Discomfort is usually felt in the top of the uterus, and it spreads to the lower abdomen and low back.  Contractions do not go away with walking.  Contractions usually become more intense and increase in frequency.  The cervix dilates and gets thinner. False labor  Contractions are usually shorter and not as strong as true labor contractions.  Contractions are usually irregular.  Contractions are often felt in the front of the lower abdomen and in the groin.  Contractions may go away when you walk around or change positions while lying down.  Contractions get weaker and are shorter-lasting as time goes on.  The cervix usually does not dilate or become thin. Follow these instructions at home:  Take over-the-counter and prescription medicines only as told by your health care provider.  Keep up with your usual exercises and follow other instructions from your health care provider.  Eat and drink lightly if you think you are going into labor.  If Braxton Hicks contractions are making you uncomfortable: ? Change your position from lying down or resting to walking, or change from walking to resting. ? Sit and rest in a tub of warm water. ? Drink enough fluid to keep your urine pale yellow. Dehydration may cause these contractions. ?   Do slow and deep breathing several times an hour.  Keep all follow-up prenatal visits as told by your health care provider. This is important. Contact a health care provider if:  You have a fever.  You have continuous pain in your abdomen. Get help right away if:  Your contractions become stronger, more  regular, and closer together.  You have fluid leaking or gushing from your vagina.  You pass blood-tinged mucus (bloody show).  You have bleeding from your vagina.  You have low back pain that you never had before.  You feel your baby's head pushing down and causing pelvic pressure.  Your baby is not moving inside you as much as it used to. Summary  Contractions that occur before labor are called Braxton Hicks contractions, false labor, or practice contractions.  Braxton Hicks contractions are usually shorter, weaker, farther apart, and less regular than true labor contractions. True labor contractions usually become progressively stronger and regular and they become more frequent.  Manage discomfort from Braxton Hicks contractions by changing position, resting in a warm bath, drinking plenty of water, or practicing deep breathing. This information is not intended to replace advice given to you by your health care provider. Make sure you discuss any questions you have with your health care provider. Document Released: 12/04/2016 Document Revised: 12/04/2016 Document Reviewed: 12/04/2016 Elsevier Interactive Patient Education  2018 [employer]  

## 2017-12-14 NOTE — Progress Notes (Signed)
   PRENATAL VISIT NOTE  Subjective:  Melanie Calderon is a 27 y.o. [phone] at [redacted]w[redacted]d being seen today for ongoing prenatal care.  She is currently monitored for the following issues for this low-risk pregnancy and has Family history of colon cancer; Allergic rhinoconjunctivitis; Perioral dermatitis; Supervision of other normal pregnancy, antepartum; History of C-section; Vitamin D deficiency; Anemia during pregnancy in second trimester; and Eczema on their problem list.  Patient reports no bleeding, no leaking, occasional contractions and increased water intake discussed.  Contractions: Not present. Vag. Bleeding: None.  Movement: Present. Denies leaking of fluid.   The following portions of the patient's history were reviewed and updated as appropriate: allergies, current medications, past family history, past medical history, past social history, past surgical history and problem list. Problem list updated.  Objective:   Vitals:   12/14/17 0821  BP: 121/84  Pulse: 69  Weight: 150 lb 3.2 oz (68.1 kg)    Fetal Status: Fetal Heart Rate (bpm): 158; doppler, +FM noteed Fundal Height: 28 cm Movement: Present     General:  Alert, oriented and cooperative. Patient is in no acute distress.  Skin: Skin is warm and dry. No rash noted.   Cardiovascular: Normal heart rate noted  Respiratory: Normal respiratory effort, no problems with respiration noted  Abdomen: Soft, gravid, appropriate for gestational age.  Pain/Pressure: Absent     Pelvic: Cervical exam deferred        Extremities: Normal range of motion.  Edema: None  Mental Status: Normal mood and affect. Normal behavior. Normal judgment and thought content.   Assessment and Plan:  Pregnancy: [id number] at [redacted]w[redacted]d  1. Supervision of other normal pregnancy, antepartum     Doing well - Glucose Tolerance, 2 Hours w/1 Hour - CBC - RPR - HIV antibody - Tdap vaccine greater than or equal to 7yo IM  2. Vitamin D deficiency     Taking weekly  vitamin D.   3. History of C-section     Repeat C-section scheduled July 27th.    4. Anemia during pregnancy in second trimester     Repeat labs today, taking iron supplementation.   Preterm labor symptoms and general obstetric precautions including but not limited to vaginal bleeding, contractions, leaking of fluid and fetal movement were reviewed in detail with the patient. Please refer to After Visit Summary for other counseling recommendations.  Return in about 2 weeks (around 12/28/2017) for ROB.  Future Appointments  Date Time Provider Department Center  12/31/2017 10:00 AM Roe Coombs, CNM [facility] None  12/31/2017  4:15 PM Dazhane Villagomez, Rodell Perna, CNM [facility] None    Roe Coombs, CNM

## 2017-12-15 LAB — GLUCOSE TOLERANCE, 2 HOURS W/ 1HR
GLUCOSE, FASTING: 70 mg/dL (ref 65–91)
Glucose, 1 hour: 102 mg/dL (ref 65–179)
Glucose, 2 hour: 97 mg/dL (ref 65–152)

## 2017-12-15 LAB — CBC
HEMOGLOBIN: 11.5 g/dL (ref 11.1–15.9)
Hematocrit: 36.2 % (ref 34.0–46.6)
MCH: 27.5 pg (ref 26.6–33.0)
MCHC: 31.8 g/dL (ref 31.5–35.7)
MCV: 87 fL (ref 79–97)
Platelets: 233 10*3/uL (ref 150–379)
RBC: 4.18 x10E6/uL (ref 3.77–5.28)
RDW: 17.6 % — ABNORMAL HIGH (ref 12.3–15.4)
WBC: 8.3 10*3/uL (ref 3.4–10.8)

## 2017-12-15 LAB — HIV ANTIBODY (ROUTINE TESTING W REFLEX): HIV SCREEN 4TH GENERATION: NONREACTIVE

## 2017-12-15 LAB — RPR: RPR Ser Ql: NONREACTIVE

## 2017-12-16 ENCOUNTER — Other Ambulatory Visit: Payer: Self-pay | Admitting: Certified Nurse Midwife

## 2017-12-16 DIAGNOSIS — O99012 Anemia complicating pregnancy, second trimester: Secondary | ICD-10-CM

## 2017-12-16 DIAGNOSIS — Z3480 Encounter for supervision of other normal pregnancy, unspecified trimester: Secondary | ICD-10-CM

## 2017-12-29 ENCOUNTER — Encounter: Payer: Self-pay | Admitting: Certified Nurse Midwife

## 2017-12-29 ENCOUNTER — Ambulatory Visit (INDEPENDENT_AMBULATORY_CARE_PROVIDER_SITE_OTHER): Payer: Medicaid Other | Admitting: Certified Nurse Midwife

## 2017-12-29 VITALS — BP 116/80 | HR 80 | Wt 149.8 lb

## 2017-12-29 DIAGNOSIS — E559 Vitamin D deficiency, unspecified: Secondary | ICD-10-CM

## 2017-12-29 DIAGNOSIS — Z3480 Encounter for supervision of other normal pregnancy, unspecified trimester: Secondary | ICD-10-CM

## 2017-12-29 DIAGNOSIS — Z98891 History of uterine scar from previous surgery: Secondary | ICD-10-CM

## 2017-12-29 NOTE — Progress Notes (Signed)
   PRENATAL VISIT NOTE  Subjective:  Melanie Calderon is a 27 y.o. [phone] at [redacted]w[redacted]d being seen today for ongoing prenatal care.  She is currently monitored for the following issues for this low-risk pregnancy and has Family history of colon cancer; Allergic rhinoconjunctivitis; Perioral dermatitis; Supervision of other normal pregnancy, antepartum; History of C-section; Vitamin D deficiency; Anemia during pregnancy in second trimester; and Eczema on their problem list.  Patient reports no bleeding, no leaking and occasional contractions.  Contractions: Irritability. Vag. Bleeding: None.  Movement: Present. Denies leaking of fluid.   The following portions of the patient's history were reviewed and updated as appropriate: allergies, current medications, past family history, past medical history, past social history, past surgical history and problem list. Problem list updated.  Objective:   Vitals:   12/29/17 1411  BP: 116/80  Pulse: 80  Weight: 149 lb 12.8 oz (67.9 kg)    Fetal Status: Fetal Heart Rate (bpm): 147; doppler Fundal Height: 30 cm Movement: Present     General:  Alert, oriented and cooperative. Patient is in no acute distress.  Skin: Skin is warm and dry. No rash noted.   Cardiovascular: Normal heart rate noted  Respiratory: Normal respiratory effort, no problems with respiration noted  Abdomen: Soft, gravid, appropriate for gestational age.  Pain/Pressure: Present     Pelvic: Cervical exam deferred        Extremities: Normal range of motion.  Edema: Trace  Mental Status: Normal mood and affect. Normal behavior. Normal judgment and thought content.   Assessment and Plan:  Pregnancy: [id number] at [redacted]w[redacted]d  1. Supervision of other normal pregnancy, antepartum     Doing well  2. Vitamin D deficiency     Taking weekly vitamin D  3. History of C-section     Scheduled repeat C-section on 02/27/18  Preterm labor symptoms and general obstetric precautions including but not  limited to vaginal bleeding, contractions, leaking of fluid and fetal movement were reviewed in detail with the patient. Please refer to After Visit Summary for other counseling recommendations.  Return in about 2 weeks (around 01/12/2018) for ROB.  No future appointments.  Roe Coombs, CNM

## 2017-12-31 ENCOUNTER — Encounter: Payer: Medicaid Other | Admitting: Certified Nurse Midwife

## 2018-01-11 ENCOUNTER — Encounter: Payer: Medicaid Other | Admitting: Certified Nurse Midwife

## 2018-01-14 ENCOUNTER — Ambulatory Visit (INDEPENDENT_AMBULATORY_CARE_PROVIDER_SITE_OTHER): Payer: Medicaid Other | Admitting: Certified Nurse Midwife

## 2018-01-14 ENCOUNTER — Encounter: Payer: Self-pay | Admitting: Certified Nurse Midwife

## 2018-01-14 ENCOUNTER — Encounter: Payer: Self-pay | Admitting: *Deleted

## 2018-01-14 VITALS — BP 117/80 | HR 85 | Wt 154.8 lb

## 2018-01-14 DIAGNOSIS — Z3483 Encounter for supervision of other normal pregnancy, third trimester: Secondary | ICD-10-CM

## 2018-01-14 DIAGNOSIS — Z3480 Encounter for supervision of other normal pregnancy, unspecified trimester: Secondary | ICD-10-CM

## 2018-01-14 DIAGNOSIS — E559 Vitamin D deficiency, unspecified: Secondary | ICD-10-CM

## 2018-01-14 NOTE — Progress Notes (Signed)
Patient reports good fetal movement with pressure and just a few contractions

## 2018-01-14 NOTE — Progress Notes (Signed)
   PRENATAL VISIT NOTE  Subjective:  Melanie Calderon is a 27 y.o. [phone] at 7820w5d being seen today for ongoing prenatal care.  She is currently monitored for the following issues for this low-risk pregnancy and has Family history of colon cancer; Allergic rhinoconjunctivitis; Perioral dermatitis; Supervision of other normal pregnancy, antepartum; History of C-section; Vitamin D deficiency; Anemia during pregnancy in second trimester; and Eczema on their problem list.  Patient reports no complaints.  Contractions: Irregular. Vag. Bleeding: None.  Movement: Present. Denies leaking of fluid.   The following portions of the patient's history were reviewed and updated as appropriate: allergies, current medications, past family history, past medical history, past social history, past surgical history and problem list. Problem list updated.  Objective:   Vitals:   01/14/18 1551  BP: 117/80  Pulse: 85  Weight: 154 lb 12.8 oz (70.2 kg)    Fetal Status: Fetal Heart Rate (bpm): 160; doppler Fundal Height: 32 cm Movement: Present     General:  Alert, oriented and cooperative. Patient is in no acute distress.  Skin: Skin is warm and dry. No rash noted.   Cardiovascular: Normal heart rate noted  Respiratory: Normal respiratory effort, no problems with respiration noted  Abdomen: Soft, gravid, appropriate for gestational age.  Pain/Pressure: Present     Pelvic: Cervical exam deferred        Extremities: Normal range of motion.  Edema: Trace  Mental Status: Normal mood and affect. Normal behavior. Normal judgment and thought content.   Assessment and Plan:  Pregnancy: [id number] at 3620w5d  1. Supervision of other normal pregnancy, antepartum     Doing well  2. Vitamin D deficiency     Taking weekly vitamin D  Preterm labor symptoms and general obstetric precautions including but not limited to vaginal bleeding, contractions, leaking of fluid and fetal movement were reviewed in detail with the  patient. Please refer to After Visit Summary for other counseling recommendations.  Return in about 2 weeks (around 01/28/2018) for ROB.  No future appointments.  Roe Coombsachelle A Maxen Rowland, CNM

## 2018-01-17 ENCOUNTER — Encounter: Payer: Self-pay | Admitting: Certified Nurse Midwife

## 2018-01-19 ENCOUNTER — Encounter (INDEPENDENT_AMBULATORY_CARE_PROVIDER_SITE_OTHER): Payer: Self-pay

## 2018-01-19 ENCOUNTER — Encounter: Payer: Self-pay | Admitting: Certified Nurse Midwife

## 2018-01-29 ENCOUNTER — Encounter: Payer: Medicaid Other | Admitting: Certified Nurse Midwife

## 2018-01-29 ENCOUNTER — Encounter: Payer: Self-pay | Admitting: Certified Nurse Midwife

## 2018-01-29 ENCOUNTER — Ambulatory Visit (INDEPENDENT_AMBULATORY_CARE_PROVIDER_SITE_OTHER): Payer: Medicaid Other | Admitting: Certified Nurse Midwife

## 2018-01-29 VITALS — BP 128/86 | HR 77 | Wt 156.8 lb

## 2018-01-29 DIAGNOSIS — Z98891 History of uterine scar from previous surgery: Secondary | ICD-10-CM

## 2018-01-29 DIAGNOSIS — Z3480 Encounter for supervision of other normal pregnancy, unspecified trimester: Secondary | ICD-10-CM

## 2018-01-29 DIAGNOSIS — E559 Vitamin D deficiency, unspecified: Secondary | ICD-10-CM

## 2018-01-29 DIAGNOSIS — O99012 Anemia complicating pregnancy, second trimester: Secondary | ICD-10-CM

## 2018-01-29 NOTE — Progress Notes (Signed)
   PRENATAL VISIT NOTE  Subjective:  Melanie Calderon is a 27 y.o. [phone] at 5625w6d being seen today for ongoing prenatal care.  She is currently monitored for the following issues for this low-risk pregnancy and has Family history of colon cancer; Allergic rhinoconjunctivitis; Perioral dermatitis; Supervision of other normal pregnancy, antepartum; History of C-section; Vitamin D deficiency; Anemia during pregnancy in second trimester; and Eczema on their problem list.  Patient reports no complaints.  Contractions: Not present. Vag. Bleeding: None.  Movement: Present. Denies leaking of fluid.   The following portions of the patient's history were reviewed and updated as appropriate: allergies, current medications, past family history, past medical history, past social history, past surgical history and problem list. Problem list updated.  Objective:   Vitals:   01/29/18 0928  BP: 128/86  Pulse: 77  Weight: 156 lb 12.8 oz (71.1 kg)    Fetal Status: Fetal Heart Rate (bpm): 159; doppler Fundal Height: 34 cm Movement: Present     General:  Alert, oriented and cooperative. Patient is in no acute distress.  Skin: Skin is warm and dry. No rash noted.   Cardiovascular: Normal heart rate noted  Respiratory: Normal respiratory effort, no problems with respiration noted  Abdomen: Soft, gravid, appropriate for gestational age.  Pain/Pressure: Present     Pelvic: Cervical exam deferred        Extremities: Normal range of motion.  Edema: Trace  Mental Status: Normal mood and affect. Normal behavior. Normal judgment and thought content.   Assessment and Plan:  Pregnancy: [id number] at 6025w6d  1. Supervision of other normal pregnancy, antepartum     Doing well.  Repeat C/section scheduled for 02/28/18 with Dr. Debroah LoopArnold.  Patient to meet Dr. Debroah LoopArnold next ROB on 02/03/17.    2. Vitamin D deficiency     Taking weekly vitamin D  3. History of C-section     Repeat C/section scheduled.   4. Anemia during  pregnancy in 3rd trimester     Hx of anemia 11.5 hgb on 12/14/17.    Preterm labor symptoms and general obstetric precautions including but not limited to vaginal bleeding, contractions, leaking of fluid and fetal movement were reviewed in detail with the patient. Please refer to After Visit Summary for other counseling recommendations.  Return in about 1 week (around 02/05/2018) for ROB, GBS.  Future Appointments  Date Time Provider Department Center  02/03/2018  4:15 PM Adam PhenixArnold, James G, MD [facility] None    Roe Coombsachelle A Thaddeaus Monica, CNM

## 2018-02-03 ENCOUNTER — Other Ambulatory Visit (HOSPITAL_COMMUNITY)
Admission: RE | Admit: 2018-02-03 | Discharge: 2018-02-03 | Disposition: A | Discharge status: Home / Self Care | Payer: Medicaid Other | Source: Ambulatory Visit | Attending: Obstetrics & Gynecology | Admitting: Obstetrics & Gynecology

## 2018-02-03 ENCOUNTER — Ambulatory Visit (INDEPENDENT_AMBULATORY_CARE_PROVIDER_SITE_OTHER): Payer: Medicaid Other | Admitting: Obstetrics & Gynecology

## 2018-02-03 VITALS — BP 118/81 | HR 77 | Wt 158.0 lb

## 2018-02-03 DIAGNOSIS — Z3480 Encounter for supervision of other normal pregnancy, unspecified trimester: Secondary | ICD-10-CM

## 2018-02-03 DIAGNOSIS — Z98891 History of uterine scar from previous surgery: Secondary | ICD-10-CM

## 2018-02-03 NOTE — Patient Instructions (Signed)
Cesarean Delivery Cesarean birth, or cesarean delivery, is the surgical delivery of a baby through an incision in the abdomen and the uterus. This may be referred to as a C-section. This procedure may be scheduled ahead of time, or it may be done in an emergency situation. Tell a health care provider about:  Any allergies you have.  All medicines you are taking, including vitamins, herbs, eye drops, creams, and over-the-counter medicines.  Any problems you or family members have had with anesthetic medicines.  Any blood disorders you have.  Any surgeries you have had.  Any medical conditions you have.  Whether you or any members of your family have a history of deep vein thrombosis (DVT) or pulmonary embolism (PE). What are the risks? Generally, this is a safe procedure. However, problems may occur, including:  Infection.  Bleeding.  Allergic reactions to medicines.  Damage to other structures or organs.  Blood clots.  Injury to your baby.  What happens before the procedure?  Follow instructions from your health care provider about eating or drinking restrictions.  Follow instructions from your health care provider about bathing before your procedure to help reduce your risk of infection.  If you know that you are going to have a cesarean delivery, do not shave your pubic area. Shaving before the procedure may increase your risk of infection.  Ask your health care provider about: ? Changing or stopping your regular medicines. This is especially important if you are taking diabetes medicines or blood thinners. ? Your pain management plan. This is especially important if you plan to breastfeed your baby. ? How long you will be in the hospital after the procedure. ? Any concerns you may have about receiving blood products if you need them during the procedure. ? Cord blood banking, if you plan to collect your baby's umbilical cord blood.  You may also want to ask your  health care provider: ? Whether you will be able to hold or breastfeed your baby while you are still in the operating room. ? Whether your baby can stay with you immediately after the procedure and during your recovery. ? Whether a family member or a person of your choice can go with you into the operating room and stay with you during the procedure, immediately after the procedure, and during your recovery.  Plan to have someone drive you home when you are discharged from the hospital. What happens during the procedure?  Fetal monitors will be placed on your abdomen to monitor your heart rate and your baby's heart rate.  Depending on the reason for your cesarean delivery, you may have a physical exam or additional testing, such as an ultrasound.  An IV tube will be inserted into one of your veins.  You may have your blood or urine tested.  You will be given antibiotic medicine to help prevent infection.  You may be given a special warming gown to wear to keep your temperature stable.  Hair may be removed from your pubic area.  The skin of your pubic area and lower abdomen will be cleaned with a germ-killing solution (antiseptic).  A catheter may be inserted into your bladder through your urethra. This drains your urine during the procedure.  You may be given one or more of the following: ? A medicine to numb the area (local anesthetic). ? A medicine to make you fall asleep (general anesthetic). ? A medicine (regional anesthetic) that is injected into your back or through a small   thin tube placed in your back (spinal anesthetic or epidural anesthetic). This numbs everything below the injection site and allows you to stay awake during your procedure. If this makes you feel nauseous, tell your health care provider. Medicines will be available to help reduce any nausea you may feel.  An incision will be made in your abdomen, and then in your uterus.  If you are awake during your  procedure, you may feel tugging and pulling in your abdomen, but you should not feel pain. If you feel pain, tell your health care provider immediately.  Your baby will be removed from your uterus. You may feel more pressure or pushing while this happens.  Immediately after birth, your baby will be dried and kept warm. You may be able to hold and breastfeed your baby. The umbilical cord may be clamped and cut during this time.  Your placenta will be removed from your uterus.  Your incisions will be closed with stitches (sutures). Staples, skin glue, or adhesive strips may also be applied to the incision in your abdomen.  Bandages (dressings) will be placed over the incision in your abdomen. The procedure may vary among health care providers and hospitals. What happens after the procedure?  Your blood pressure, heart rate, breathing rate, and blood oxygen level will be monitored often until the medicines you were given have worn off.  You may continue to receive fluids and medicines through an IV tube.  You will have some pain. Medicines will be available to help control your pain.  To help prevent blood clots: ? You may be given medicines. ? You may have to wear compression stockings or devices. ? You will be encouraged to walk around when you are able.  Hospital staff will encourage and support bonding with your baby. Your hospital may allow you and your baby to stay in the same room (rooming in) during your hospital stay to encourage successful breastfeeding.  You may be encouraged to cough and breathe deeply often. This helps to prevent lung problems.  If you have a catheter draining your urine, it will be removed as soon as possible after your procedure. This information is not intended to replace advice given to you by your health care provider. Make sure you discuss any questions you have with your health care provider. Document Released: 07/21/2005 Document Revised: 12/27/2015  Document Reviewed: 05/01/2015 Elsevier Interactive Patient Education  2018 [employer]  

## 2018-02-03 NOTE — Progress Notes (Signed)
   PRENATAL VISIT NOTE  Subjective:  Melanie Calderon is a 27 y.o. [phone] at 3162w4d being seen today for ongoing prenatal care.  She is currently monitored for the following issues for this low-risk pregnancy and has Family history of colon cancer; Allergic rhinoconjunctivitis; Perioral dermatitis; Supervision of other normal pregnancy, antepartum; History of C-section; Vitamin D deficiency; Anemia during pregnancy in second trimester; and Eczema on their problem list.  Patient reports occasional contractions.  Contractions: Irregular. Vag. Bleeding: None.  Movement: Present. Denies leaking of fluid.   The following portions of the patient's history were reviewed and updated as appropriate: allergies, current medications, past family history, past medical history, past social history, past surgical history and problem list. Problem list updated.  Objective:   Vitals:   02/03/18 1517  BP: 118/81  Pulse: 77  Weight: 158 lb (71.7 kg)    Fetal Status: Fetal Heart Rate (bpm): 130 Fundal Height: 36 cm Movement: Present     General:  Alert, oriented and cooperative. Patient is in no acute distress.  Skin: Skin is warm and dry. No rash noted.   Cardiovascular: Normal heart rate noted  Respiratory: Normal respiratory effort, no problems with respiration noted  Abdomen: Soft, gravid, appropriate for gestational age.  Pain/Pressure: Present     Pelvic: Cervical exam performed        Extremities: Normal range of motion.  Edema: Trace  Mental Status: Normal mood and affect. Normal behavior. Normal judgment and thought content.   Assessment and Plan:  Pregnancy: [id number] at 2362w4d  There are no diagnoses linked to this encounter. Preterm labor symptoms and general obstetric precautions including but not limited to vaginal bleeding, contractions, leaking of fluid and fetal movement were reviewed in detail with the patient. Please refer to After Visit Summary for other counseling recommendations.    Return in about 1 week (around 02/10/2018).  Future Appointments  Date Time Provider Department Center  02/11/2018  4:15 PM Roe Coombsenney, Rachelle A, CNM [facility] None    Scheryl DarterJames Arnold, MD

## 2018-02-05 LAB — CERVICOVAGINAL ANCILLARY ONLY
Chlamydia: NEGATIVE
Neisseria Gonorrhea: NEGATIVE

## 2018-02-05 LAB — STREP GP B NAA: STREP GROUP B AG: POSITIVE — AB

## 2018-02-11 ENCOUNTER — Encounter: Payer: Self-pay | Admitting: *Deleted

## 2018-02-11 ENCOUNTER — Ambulatory Visit (INDEPENDENT_AMBULATORY_CARE_PROVIDER_SITE_OTHER): Payer: Medicaid Other | Admitting: Obstetrics

## 2018-02-11 ENCOUNTER — Encounter: Payer: Self-pay | Admitting: Obstetrics

## 2018-02-11 VITALS — BP 126/84 | HR 89 | Wt 157.8 lb

## 2018-02-11 DIAGNOSIS — Z3483 Encounter for supervision of other normal pregnancy, third trimester: Secondary | ICD-10-CM

## 2018-02-11 DIAGNOSIS — Z3480 Encounter for supervision of other normal pregnancy, unspecified trimester: Secondary | ICD-10-CM

## 2018-02-11 DIAGNOSIS — K0889 Other specified disorders of teeth and supporting structures: Secondary | ICD-10-CM

## 2018-02-11 MED ORDER — CLINDAMYCIN HCL 300 MG PO CAPS: 300.0000 mg | ORAL_CAPSULE | Freq: Three times a day (TID) | ORAL | 0 refills | Status: DC

## 2018-02-11 MED ORDER — OXYCODONE-ACETAMINOPHEN 5-325 MG PO TABS
1.0000 | ORAL_TABLET | ORAL | 0 refills | Status: DC | PRN
Start: 2018-02-11 — End: 2018-02-18

## 2018-02-11 NOTE — Progress Notes (Signed)
Patient reports good fetal movement with occasional pressure and irregular contractions. 

## 2018-02-11 NOTE — Progress Notes (Signed)
Subjective:  Melanie Calderon is a 27 y.o. [phone] at 2423w5d being seen today for ongoing prenatal care.  She is currently monitored for the following issues for this low-risk pregnancy and has Family history of colon cancer; Allergic rhinoconjunctivitis; Perioral dermatitis; Supervision of other normal pregnancy, antepartum; History of C-section; Vitamin D deficiency; Anemia during pregnancy in second trimester; and Eczema on their problem list.  Patient reports toothache.  Contractions: Irregular. Vag. Bleeding: None.  Movement: Present. Denies leaking of fluid.   The following portions of the patient's history were reviewed and updated as appropriate: allergies, current medications, past family history, past medical history, past social history, past surgical history and problem list. Problem list updated.  Objective:   Vitals:   02/11/18 1623  BP: 126/84  Pulse: 89  Weight: 157 lb 12.8 oz (71.6 kg)    Fetal Status:     Movement: Present     General:  Alert, oriented and cooperative. Patient is in no acute distress.  Skin: Skin is warm and dry. No rash noted.   Cardiovascular: Normal heart rate noted  Respiratory: Normal respiratory effort, no problems with respiration noted  Abdomen: Soft, gravid, appropriate for gestational age. Pain/Pressure: Present     Pelvic:  Cervical exam deferred        Extremities: Normal range of motion.  Edema: Trace  Mental Status: Normal mood and affect. Normal behavior. Normal judgment and thought content.   Urinalysis:      Assessment and Plan:  Pregnancy: [id number] at 4923w5d  1. Supervision of other normal pregnancy, antepartum  2. Toothache Rx: - oxyCODONE-acetaminophen (PERCOCET/ROXICET) 5-325 MG tablet; Take 1 tablet by mouth every 4 (four) hours as needed for severe pain.  Dispense: 20 tablet; Refill: 0 - clindamycin (CLEOCIN) 300 MG capsule; Take 1 capsule (300 mg total) by mouth 3 (three) times daily.  Dispense: 21 capsule; Refill: 0 -  has dental appointment  Term labor symptoms and general obstetric precautions including but not limited to vaginal bleeding, contractions, leaking of fluid and fetal movement were reviewed in detail with the patient. Please refer to After Visit Summary for other counseling recommendations.  Return in about 1 week (around 02/18/2018) for ROB.   Brock BadHarper, Mariacristina Aday A, MD

## 2018-02-16 ENCOUNTER — Telehealth (HOSPITAL_COMMUNITY): Payer: Self-pay | Admitting: *Deleted

## 2018-02-16 NOTE — Telephone Encounter (Signed)
Preadmission screen  

## 2018-02-17 ENCOUNTER — Telehealth (HOSPITAL_COMMUNITY): Payer: Self-pay | Admitting: *Deleted

## 2018-02-17 NOTE — Telephone Encounter (Signed)
Preadmission screen  

## 2018-02-18 ENCOUNTER — Telehealth (HOSPITAL_COMMUNITY): Payer: Self-pay | Admitting: *Deleted

## 2018-02-18 ENCOUNTER — Encounter: Payer: Self-pay | Admitting: Obstetrics and Gynecology

## 2018-02-18 ENCOUNTER — Encounter (HOSPITAL_COMMUNITY): Payer: Self-pay

## 2018-02-18 ENCOUNTER — Other Ambulatory Visit: Payer: Self-pay

## 2018-02-18 ENCOUNTER — Ambulatory Visit (INDEPENDENT_AMBULATORY_CARE_PROVIDER_SITE_OTHER): Payer: Medicaid Other | Admitting: Obstetrics and Gynecology

## 2018-02-18 ENCOUNTER — Inpatient Hospital Stay (EMERGENCY_DEPARTMENT_HOSPITAL)
Admission: AD | Admit: 2018-02-18 | Discharge: 2018-02-18 | Disposition: A | Discharge status: Home / Self Care | Payer: Medicaid Other | Source: Ambulatory Visit | Attending: Obstetrics & Gynecology | Admitting: Obstetrics & Gynecology

## 2018-02-18 VITALS — BP 143/103 | HR 87 | Wt 159.0 lb

## 2018-02-18 DIAGNOSIS — Z3A37 37 weeks gestation of pregnancy: Secondary | ICD-10-CM | POA: Insufficient documentation

## 2018-02-18 DIAGNOSIS — Z803 Family history of malignant neoplasm of breast: Secondary | ICD-10-CM

## 2018-02-18 DIAGNOSIS — O163 Unspecified maternal hypertension, third trimester: Secondary | ICD-10-CM

## 2018-02-18 DIAGNOSIS — Z8249 Family history of ischemic heart disease and other diseases of the circulatory system: Secondary | ICD-10-CM | POA: Insufficient documentation

## 2018-02-18 DIAGNOSIS — Z841 Family history of disorders of kidney and ureter: Secondary | ICD-10-CM

## 2018-02-18 DIAGNOSIS — Z79899 Other long term (current) drug therapy: Secondary | ICD-10-CM

## 2018-02-18 DIAGNOSIS — Z79891 Long term (current) use of opiate analgesic: Secondary | ICD-10-CM | POA: Insufficient documentation

## 2018-02-18 DIAGNOSIS — O133 Gestational [pregnancy-induced] hypertension without significant proteinuria, third trimester: Secondary | ICD-10-CM | POA: Diagnosis not present

## 2018-02-18 DIAGNOSIS — Z3480 Encounter for supervision of other normal pregnancy, unspecified trimester: Secondary | ICD-10-CM

## 2018-02-18 DIAGNOSIS — Z800 Family history of malignant neoplasm of digestive organs: Secondary | ICD-10-CM | POA: Insufficient documentation

## 2018-02-18 DIAGNOSIS — Z3689 Encounter for other specified antenatal screening: Secondary | ICD-10-CM | POA: Insufficient documentation

## 2018-02-18 DIAGNOSIS — R030 Elevated blood-pressure reading, without diagnosis of hypertension: Secondary | ICD-10-CM

## 2018-02-18 DIAGNOSIS — Z833 Family history of diabetes mellitus: Secondary | ICD-10-CM

## 2018-02-18 DIAGNOSIS — Z98891 History of uterine scar from previous surgery: Secondary | ICD-10-CM

## 2018-02-18 LAB — COMPREHENSIVE METABOLIC PANEL
ALBUMIN: 3 g/dL — AB (ref 3.5–5.0)
ALK PHOS: 143 U/L — AB (ref 38–126)
ALT: 14 U/L (ref 0–44)
ANION GAP: 9 (ref 5–15)
AST: 23 U/L (ref 15–41)
BUN: 10 mg/dL (ref 6–20)
CO2: 23 mmol/L (ref 22–32)
Calcium: 9.1 mg/dL (ref 8.9–10.3)
Chloride: 104 mmol/L (ref 98–111)
Creatinine, Ser: 0.84 mg/dL (ref 0.44–1.00)
GFR calc Af Amer: >60 mL/min (ref >60)
GFR calc non Af Amer: >60 mL/min (ref >60)
GLUCOSE: 77 mg/dL (ref 70–99)
POTASSIUM: 4.2 mmol/L (ref 3.5–5.1)
SODIUM: 136 mmol/L (ref 135–145)
Total Bilirubin: 0.3 mg/dL (ref 0.3–1.2)
Total Protein: 6.2 g/dL — ABNORMAL LOW (ref 6.5–8.1)

## 2018-02-18 LAB — CBC
HCT: 37.5 % (ref 36.0–46.0)
HEMOGLOBIN: 12.6 g/dL (ref 12.0–15.0)
MCH: 30.2 pg (ref 26.0–34.0)
MCHC: 33.6 g/dL (ref 30.0–36.0)
MCV: 89.9 fL (ref 78.0–100.0)
Platelets: 162 10*3/uL (ref 150–400)
RBC: 4.17 MIL/uL (ref 3.87–5.11)
RDW: 14.7 % (ref 11.5–15.5)
WBC: 6.7 10*3/uL (ref 4.0–10.5)

## 2018-02-18 LAB — PROTEIN / CREATININE RATIO, URINE
Creatinine, Urine: 315 mg/dL
Protein Creatinine Ratio: 0.09 mg/mg{Cre} (ref 0.00–0.15)
Total Protein, Urine: 28 mg/dL

## 2018-02-18 NOTE — MAU Provider Note (Signed)
History     CSN: [id number]  Arrival date and time: 02/18/18 1558   First Provider Initiated Contact with Patient 02/18/18 1657     [id number] @37 .5 wks sent from office for elevated BPs. Denies HA, visual disturbances, RUQ pain, SOB, and CP. Reports good FM. No VB, LOF, or ctx.    OB History    Gravida  4   Para  2   Term  2   Preterm      AB  1   Living  2     SAB  1   TAB      Ectopic      Multiple      Live Births  2           Past Medical History:  Diagnosis Date  . Anemia   . Anemia   . Complication of anesthesia    problems getting numb  . Scoliosis     Past Surgical History:  Procedure Laterality Date  . CAESA    . CESAREAN SECTION     x2    Family History  Problem Relation Age of Onset  . Cancer Mother        colon cancer  . Diabetes Mother   . Hypertension Mother   . Diabetes Father   . Hypertension Father   . Kidney disease Maternal Aunt   . Hypertension Maternal Aunt   . Diabetes Maternal Aunt   . Cancer Maternal Aunt        Breast  . Diabetes Maternal Uncle   . Hypertension Maternal Uncle   . Cancer Maternal Grandmother   . Hypertension Maternal Grandfather     Social History   Tobacco Use  . Smoking status: Never Smoker  . Smokeless tobacco: Never Used  Substance Use Topics  . Alcohol use: No    Alcohol/week: 0.0 oz    Frequency: Never  . Drug use: No    Allergies: No Known Allergies  Medications Prior to Admission  Medication Sig Dispense Refill Last Dose  . clindamycin (CLEOCIN) 300 MG capsule Take 1 capsule (300 mg total) by mouth 3 (three) times daily. 21 capsule 0 02/18/2018 at Unknown time  . Prenatal-DSS-FeCb-FeGl-FA (CITRANATAL BLOOM) 90-1 MG TABS Take 1 tablet by mouth daily. 30 tablet 12 02/18/2018 at Unknown time  . triamcinolone ointment (KENALOG) 0.5 % Apply 1 application topically 2 (two) times daily. 30 g 0 Past Month at Unknown time  . Vitamin D, Ergocalciferol, (DRISDOL) 50000 units CAPS capsule  Take 1 capsule (50,000 Units total) by mouth every 7 (seven) days. 30 capsule 2 02/15/2018 at Unknown time  . cetirizine (ZYRTEC) 10 MG tablet Take 1 tablet (10 mg total) by mouth daily. (Patient not taking: Reported on 11/16/2017) 30 tablet 12 Not Taking  . oxyCODONE-acetaminophen (PERCOCET/ROXICET) 5-325 MG tablet Take 1 tablet by mouth every 4 (four) hours as needed for severe pain. (Patient not taking: Reported on 02/18/2018) 20 tablet 0 Not Taking at Unknown time    Review of Systems  Eyes: Negative for visual disturbance.  Gastrointestinal: Negative for abdominal pain.  Genitourinary: Negative for vaginal bleeding.  Neurological: Negative for headaches.   Physical Exam   Blood pressure 124/89, pulse 81, temperature 98.9 F (37.2 C), temperature source Oral, resp. rate 18, weight 159 lb 12 oz (72.5 kg), last menstrual period 05/30/2017, SpO2 99 %, unknown if currently breastfeeding. Patient Vitals for the past 24 hrs:  BP Temp Temp src Pulse Resp SpO2 Weight  02/18/18 1817  124/82 - - 75 - - -  02/18/18 1801 124/82 - - 75 - - -  02/18/18 1746 121/83 - - 67 - - -  02/18/18 1731 124/89 - - 74 - - -  02/18/18 1715 126/86 - - - - - -  02/18/18 1652 124/89 - - - - - -  02/18/18 1619 (!) 136/99 98.9 F (37.2 C) Oral 81 18 99 % 159 lb 12 oz (72.5 kg)    Physical Exam  Constitutional: She is oriented to person, place, and time. She appears well-developed and well-nourished. No distress.  HENT:  Head: Normocephalic and atraumatic.  Neck: Normal range of motion.  Cardiovascular: Normal rate.  Respiratory: Effort normal. No respiratory distress.  GI: Soft. She exhibits no distension. There is no tenderness.  gravid  Musculoskeletal: Normal range of motion.  Neurological: She is alert and oriented to person, place, and time.  Skin: Skin is warm and dry.  Psychiatric: She has a normal mood and affect.  EFM: 145 bpm, mod variability, + accels, no decels Toco: irregular  Results for  orders placed or performed during the hospital encounter of 02/18/18 (from the past 24 hour(s))  Protein / creatinine ratio, urine     Status: None   Collection Time: 02/18/18  4:00 PM  Result Value Ref Range   Creatinine, Urine 315.00 mg/dL   Total Protein, Urine 28 mg/dL   Protein Creatinine Ratio 0.09 0.00 - 0.15 mg/mg[Cre]  Comprehensive metabolic panel     Status: Abnormal   Collection Time: 02/18/18  5:15 PM  Result Value Ref Range   Sodium 136 135 - 145 mmol/L   Potassium 4.2 3.5 - 5.1 mmol/L   Chloride 104 98 - 111 mmol/L   CO2 23 22 - 32 mmol/L   Glucose, Bld 77 70 - 99 mg/dL   BUN 10 6 - 20 mg/dL   Creatinine, Ser 1.61 0.44 - 1.00 mg/dL   Calcium 9.1 8.9 - 09.6 mg/dL   Total Protein 6.2 (L) 6.5 - 8.1 g/dL   Albumin 3.0 (L) 3.5 - 5.0 g/dL   AST 23 15 - 41 U/L   ALT 14 0 - 44 U/L   Alkaline Phosphatase 143 (H) 38 - 126 U/L   Total Bilirubin 0.3 0.3 - 1.2 mg/dL   GFR calc non Af Amer >60 >60 mL/min   GFR calc Af Amer >60 >60 mL/min   Anion gap 9 5 - 15  CBC     Status: None   Collection Time: 02/18/18  5:15 PM  Result Value Ref Range   WBC 6.7 4.0 - 10.5 K/uL   RBC 4.17 3.87 - 5.11 MIL/uL   Hemoglobin 12.6 12.0 - 15.0 g/dL   HCT 04.5 40.9 - 81.1 %   MCV 89.9 78.0 - 100.0 fL   MCH 30.2 26.0 - 34.0 pg   MCHC 33.6 30.0 - 36.0 g/dL   RDW 91.4 78.2 - 95.6 %   Platelets 162 150 - 400 K/uL    MAU Course  Procedures  MDM Labs ordered and reviewed. No evidence of PEC. BPs stable. Stable for discharge home.   Assessment and Plan   1. [redacted] weeks gestation of pregnancy   2. NST (non-stress test) reactive   3. Hypertension affecting pregnancy in third trimester    Discharge home Follow up at [facility] tomorrow for BP check-message sent PEC precautions  Allergies as of 02/18/2018   No Known Allergies     Medication List    STOP  taking these medications   cetirizine 10 MG tablet Commonly known as:  ZYRTEC   oxyCODONE-acetaminophen 5-325 MG tablet Commonly known  as:  PERCOCET/ROXICET     TAKE these medications   CITRANATAL BLOOM 90-1 MG Tabs Take 1 tablet by mouth daily.   clindamycin 300 MG capsule Commonly known as:  CLEOCIN Take 1 capsule (300 mg total) by mouth 3 (three) times daily.   triamcinolone ointment 0.5 % Commonly known as:  KENALOG Apply 1 application topically 2 (two) times daily.   Vitamin D (Ergocalciferol) 50000 units Caps capsule Commonly known as:  DRISDOL Take 1 capsule (50,000 Units total) by mouth every 7 (seven) days.      Donette Larry, CNM 02/18/2018, 5:13 PM

## 2018-02-18 NOTE — MAU Note (Signed)
Sent over from prenatal appt.  BP was elevated today. Denies HA, visual changes, epigastric pain.  Legs and feet have been swelling. No bleeding.  ? Leaking- not sure if d/c or water.

## 2018-02-18 NOTE — Progress Notes (Signed)
   PRENATAL VISIT NOTE  Subjective:  Melanie Calderon is a 27 y.o. [phone] at 8683w5d being seen today for ongoing prenatal care.  She is currently monitored for the following issues for this low-risk pregnancy and has Family history of colon cancer; Allergic rhinoconjunctivitis; Perioral dermatitis; Supervision of other normal pregnancy, antepartum; History of C-section; Vitamin D deficiency; Anemia during pregnancy in second trimester; and Eczema on their problem list.  Patient reports occasional contractions.  Contractions: Irregular. Vag. Bleeding: None.  Movement: Present. Denies leaking of fluid.   The following portions of the patient's history were reviewed and updated as appropriate: allergies, current medications, past family history, past medical history, past social history, past surgical history and problem list. Problem list updated.  Objective:   Vitals:   02/18/18 1529 02/18/18 1535  BP: (!) 145/109 (!) 143/103  Pulse: 87   Weight: 159 lb (72.1 kg)     Fetal Status: Fetal Heart Rate (bpm): 155   Movement: Present     General:  Alert, oriented and cooperative. Patient is in no acute distress.  Skin: Skin is warm and dry. No rash noted.   Cardiovascular: Normal heart rate noted  Respiratory: Normal respiratory effort, no problems with respiration noted  Abdomen: Soft, gravid, appropriate for gestational age.  Pain/Pressure: Present     Pelvic: Cervical exam deferred        Extremities: Normal range of motion.     Mental Status: Normal mood and affect. Normal behavior. Normal judgment and thought content.   Assessment and Plan:  Pregnancy: [id number] at 7283w5d  1. Supervision of other normal pregnancy, antepartum  2. History of C-section Repeat scheduled for 7/28  3. Elevated BP without the diagnosis of HTN Asymptomatic To MAU for eval and possible delivery  MAU aware.   Term labor symptoms and general obstetric precautions including but not limited to vaginal  bleeding, contractions, leaking of fluid and fetal movement were reviewed in detail with the patient. Please refer to After Visit Summary for other counseling recommendations.  Return in about 1 week (around 02/25/2018) for OB visit (MD).  Future Appointments  Date Time Provider Department Center  02/18/2018  4:00 PM Conan Bowensavis, Chanoch Mccleery M, MD [facility] None  02/26/2018  9:45 AM [facility] PAT 5 [facility] None    Conan BowensKelly M Ichiro Chesnut, MD

## 2018-02-18 NOTE — Discharge Instructions (Signed)

## 2018-02-18 NOTE — Telephone Encounter (Signed)
Preadmission screen  

## 2018-02-19 ENCOUNTER — Inpatient Hospital Stay (HOSPITAL_COMMUNITY)
Admission: AD | Admit: 2018-02-19 | Discharge: 2018-02-19 | Disposition: A | Discharge status: Home / Self Care | Payer: Medicaid Other | Source: Ambulatory Visit | Attending: Obstetrics & Gynecology | Admitting: Obstetrics & Gynecology

## 2018-02-19 ENCOUNTER — Encounter (HOSPITAL_COMMUNITY)
Admission: AD | Disposition: A | Discharge status: Home / Self Care | Payer: Self-pay | Source: Home / Self Care | Attending: Obstetrics and Gynecology

## 2018-02-19 ENCOUNTER — Ambulatory Visit (INDEPENDENT_AMBULATORY_CARE_PROVIDER_SITE_OTHER): Payer: Medicaid Other | Admitting: Obstetrics

## 2018-02-19 ENCOUNTER — Encounter (HOSPITAL_COMMUNITY): Payer: Self-pay | Admitting: *Deleted

## 2018-02-19 ENCOUNTER — Encounter: Payer: Self-pay | Admitting: Obstetrics

## 2018-02-19 ENCOUNTER — Encounter (HOSPITAL_COMMUNITY): Payer: Self-pay

## 2018-02-19 ENCOUNTER — Inpatient Hospital Stay (HOSPITAL_COMMUNITY)
Admission: AD | Admit: 2018-02-19 | Discharge: 2018-02-23 | DRG: 788 | Disposition: A | Discharge status: Home / Self Care | Payer: Medicaid Other | Attending: Obstetrics and Gynecology | Admitting: Obstetrics and Gynecology

## 2018-02-19 VITALS — BP 134/82 | HR 67

## 2018-02-19 DIAGNOSIS — O479 False labor, unspecified: Secondary | ICD-10-CM

## 2018-02-19 DIAGNOSIS — M419 Scoliosis, unspecified: Secondary | ICD-10-CM | POA: Diagnosis present

## 2018-02-19 DIAGNOSIS — Z3A37 37 weeks gestation of pregnancy: Secondary | ICD-10-CM

## 2018-02-19 DIAGNOSIS — O99824 Streptococcus B carrier state complicating childbirth: Secondary | ICD-10-CM | POA: Diagnosis present

## 2018-02-19 DIAGNOSIS — Z9889 Other specified postprocedural states: Secondary | ICD-10-CM

## 2018-02-19 DIAGNOSIS — Z0130 Encounter for examination of blood pressure without abnormal findings: Secondary | ICD-10-CM

## 2018-02-19 DIAGNOSIS — O34211 Maternal care for low transverse scar from previous cesarean delivery: Principal | ICD-10-CM | POA: Diagnosis present

## 2018-02-19 LAB — URINALYSIS, ROUTINE W REFLEX MICROSCOPIC
BILIRUBIN URINE: NEGATIVE
Glucose, UA: NEGATIVE mg/dL
KETONES UR: NEGATIVE mg/dL
Nitrite: NEGATIVE
PH: 7 (ref 5.0–8.0)
Protein, ur: NEGATIVE mg/dL
SPECIFIC GRAVITY, URINE: 1.001 — AB (ref 1.005–1.030)

## 2018-02-19 SURGERY — Surgical Case
Anesthesia: Spinal

## 2018-02-19 SURGERY — Surgical Case
Anesthesia: Regional

## 2018-02-19 MED ORDER — SOD CITRATE-CITRIC ACID 500-334 MG/5ML PO SOLN
ORAL | Status: AC
Start: 2018-02-19 — End: 2018-02-19
  Administered 2018-02-19: 30 mL
  Filled 2018-02-19: qty 15

## 2018-02-19 MED ORDER — FENTANYL CITRATE (PF) 100 MCG/2ML IJ SOLN
INTRAMUSCULAR | Status: AC
  Filled 2018-02-19: qty 2

## 2018-02-19 MED ORDER — MORPHINE SULFATE (PF) 0.5 MG/ML IJ SOLN
INTRAMUSCULAR | Status: AC
  Filled 2018-02-19: qty 10

## 2018-02-19 MED ORDER — SCOPOLAMINE 1 MG/3DAYS TD PT72
1.0000 | MEDICATED_PATCH | Freq: Once | TRANSDERMAL | Status: DC
  Administered 2018-02-20: 1.5 mg via TRANSDERMAL

## 2018-02-19 MED ORDER — ONDANSETRON HCL 4 MG/2ML IJ SOLN
INTRAMUSCULAR | Status: AC
  Filled 2018-02-19: qty 2

## 2018-02-19 MED ORDER — FAMOTIDINE IN NACL 20-0.9 MG/50ML-% IV SOLN
INTRAVENOUS | Status: AC
  Administered 2018-02-19: 20 mg
  Filled 2018-02-19: qty 50

## 2018-02-19 MED ORDER — OXYCODONE-ACETAMINOPHEN 5-325 MG PO TABS
1.0000 | ORAL_TABLET | Freq: Once | ORAL | Status: AC
  Administered 2018-02-19: 2 via ORAL
  Filled 2018-02-19: qty 2

## 2018-02-19 MED ORDER — SCOPOLAMINE 1 MG/3DAYS TD PT72
MEDICATED_PATCH | TRANSDERMAL | Status: AC
  Filled 2018-02-19: qty 1

## 2018-02-19 MED ORDER — PHENYLEPHRINE 8 MG IN D5W 100 ML (0.08MG/ML) PREMIX OPTIME
INJECTION | INTRAVENOUS | Status: AC
  Filled 2018-02-19: qty 100

## 2018-02-19 MED ORDER — OXYTOCIN 10 UNIT/ML IJ SOLN
INTRAMUSCULAR | Status: AC
  Filled 2018-02-19: qty 4

## 2018-02-19 MED ORDER — LACTATED RINGERS IV BOLUS
500.0000 mL | Freq: Once | INTRAVENOUS | Status: AC
  Administered 2018-02-19: 500 mL via INTRAVENOUS

## 2018-02-19 MED ORDER — TRANEXAMIC ACID 1000 MG/10ML IV SOLN
1000.0000 mg | INTRAVENOUS | Status: AC
  Administered 2018-02-20: 1000 mg via INTRAVENOUS
  Filled 2018-02-19: qty 1100

## 2018-02-19 SURGICAL SUPPLY — 35 items
APL SKNCLS STERI-STRIP NONHPOA (GAUZE/BANDAGES/DRESSINGS) ×1
BARRIER ADHS 3X4 INTERCEED (GAUZE/BANDAGES/DRESSINGS) IMPLANT
BENZOIN TINCTURE PRP APPL 2/3 (GAUZE/BANDAGES/DRESSINGS) ×2 IMPLANT
BRR ADH 4X3 ABS CNTRL BYND (GAUZE/BANDAGES/DRESSINGS)
CHLORAPREP W/TINT 26ML (MISCELLANEOUS) ×3 IMPLANT
CLAMP CORD UMBIL (MISCELLANEOUS) IMPLANT
CLOSURE STERI-STRIP 1/2X4 (GAUZE/BANDAGES/DRESSINGS) ×1
CLOTH BEACON ORANGE TIMEOUT ST (SAFETY) ×3 IMPLANT
CLSR STERI-STRIP ANTIMIC 1/2X4 (GAUZE/BANDAGES/DRESSINGS) ×1 IMPLANT
DRSG OPSITE POSTOP 4X10 (GAUZE/BANDAGES/DRESSINGS) ×3 IMPLANT
ELECT REM PT RETURN 9FT ADLT (ELECTROSURGICAL) ×3
ELECTRODE REM PT RTRN 9FT ADLT (ELECTROSURGICAL) ×1 IMPLANT
EXTRACTOR VACUUM KIWI (MISCELLANEOUS) IMPLANT
GLOVE BIO SURGEON STRL SZ 6.5 (GLOVE) ×2 IMPLANT
GLOVE BIO SURGEONS STRL SZ 6.5 (GLOVE) ×1
GLOVE BIOGEL PI IND STRL 7.0 (GLOVE) ×2 IMPLANT
GLOVE BIOGEL PI INDICATOR 7.0 (GLOVE) ×4
GOWN STRL REUS W/TWL LRG LVL3 (GOWN DISPOSABLE) ×6 IMPLANT
KIT ABG SYR 3ML LUER SLIP (SYRINGE) IMPLANT
NDL HYPO 25X5/8 SAFETYGLIDE (NEEDLE) IMPLANT
NEEDLE HYPO 22GX1.5 SAFETY (NEEDLE) IMPLANT
NEEDLE HYPO 25X5/8 SAFETYGLIDE (NEEDLE) IMPLANT
NS IRRIG 1000ML POUR BTL (IV SOLUTION) ×3 IMPLANT
PACK C SECTION WH (CUSTOM PROCEDURE TRAY) ×3 IMPLANT
PAD OB MATERNITY 4.3X12.25 (PERSONAL CARE ITEMS) ×3 IMPLANT
PENCIL SMOKE EVAC W/HOLSTER (ELECTROSURGICAL) ×3 IMPLANT
RETRACTOR WND ALEXIS 25 LRG (MISCELLANEOUS) IMPLANT
RTRCTR WOUND ALEXIS 25CM LRG (MISCELLANEOUS)
SUT VIC AB 0 CT1 36 (SUTURE) ×18 IMPLANT
SUT VIC AB 2-0 CT1 27 (SUTURE) ×3
SUT VIC AB 2-0 CT1 TAPERPNT 27 (SUTURE) ×1 IMPLANT
SUT VIC AB 4-0 PS2 27 (SUTURE) ×3 IMPLANT
SYR CONTROL 10ML LL (SYRINGE) IMPLANT
TOWEL OR 17X24 6PK STRL BLUE (TOWEL DISPOSABLE) ×3 IMPLANT
TRAY FOLEY W/BAG SLVR 14FR LF (SET/KITS/TRAYS/PACK) IMPLANT

## 2018-02-19 NOTE — Progress Notes (Signed)
Subjective:  Melanie Calderon is a 27 y.o. female here for BP check.   Hypertension ROS: taking medications as instructed, no medication side effects noted, no TIA's, no chest pain on exertion, no dyspnea on exertion and no swelling of ankles.    Objective:  LMP 05/30/2017   Appearance alert, well appearing, and in no distress. General exam BP noted to be well controlled today in office.    Assessment:   Blood Pressure well controlled.   Plan:  Current treatment plan is effective, no change in therapy..Marland Kitchen

## 2018-02-19 NOTE — MAU Note (Signed)
Seen earlier today for bleeding and ctxs. Returns tonight for stronger and closer cts. Some bloody show. Denies LOF .

## 2018-02-19 NOTE — MAU Note (Signed)
I have communicated with Dr. Natasha Meadlson/Fran Cresendo-Dishmon CNM and reviewed vital signs:  Vitals:   02/19/18 0338  BP: 129/88  Pulse: 75  Resp: 18  Temp: 97.9 F (36.6 C)  SpO2: 97%    Vaginal exam:  Dilation: Fingertip Effacement (%): 30 Cervical Position: Posterior Station: Ballotable Presentation: Vertex Exam by:: Ciscomber Keenya Matera RN,   Also reviewed contraction pattern and that non-stress test is reactive.  It has been documented that patient is contracting every 4-8 minutes with no cervical change over 1 hour not indicating active labor.  Patient denies any other complaints.  Requests something for pain. Based on this report provider has given order for Percocet and discharge home.  A discharge order and diagnosis entered by a provider.   Labor discharge instructions reviewed with patient.

## 2018-02-19 NOTE — MAU Note (Signed)
Pt states she woke up and went to the bathroom and noticed some bleeding with mucous when wiping. Pt reports irregular contractions that started at 2am. Reports fetal movement. Cervix has not been examined.

## 2018-02-19 NOTE — MAU Note (Signed)
PT SAYS HE WAS HERE LAST NIGHT -  FOR UC - SENT HOME  .

## 2018-02-19 NOTE — Discharge Instructions (Signed)
Braxton Hicks Contractions °Contractions of the uterus can occur throughout pregnancy, but they are not always a sign that you are in labor. You may have practice contractions called Braxton Hicks contractions. These false labor contractions are sometimes confused with true labor. °What are Braxton Hicks contractions? °Braxton Hicks contractions are tightening movements that occur in the muscles of the uterus before labor. Unlike true labor contractions, these contractions do not result in opening (dilation) and thinning of the cervix. Toward the end of pregnancy (32-34 weeks), Braxton Hicks contractions can happen more often and may become stronger. These contractions are sometimes difficult to tell apart from true labor because they can be very uncomfortable. You should not feel embarrassed if you go to the hospital with false labor. °Sometimes, the only way to tell if you are in true labor is for your health care provider to look for changes in the cervix. The health care provider will do a physical exam and may monitor your contractions. If you are not in true labor, the exam should show that your cervix is not dilating and your water has not broken. °If there are other health problems associated with your pregnancy, it is completely safe for you to be sent home with false labor. You may continue to have Braxton Hicks contractions until you go into true labor. °How to tell the difference between true labor and false labor °True labor °· Contractions last 30-70 seconds. °· Contractions become very regular. °· Discomfort is usually felt in the top of the uterus, and it spreads to the lower abdomen and low back. °· Contractions do not go away with walking. °· Contractions usually become more intense and increase in frequency. °· The cervix dilates and gets thinner. °False labor °· Contractions are usually shorter and not as strong as true labor contractions. °· Contractions are usually irregular. °· Contractions  are often felt in the front of the lower abdomen and in the groin. °· Contractions may go away when you walk around or change positions while lying down. °· Contractions get weaker and are shorter-lasting as time goes on. °· The cervix usually does not dilate or become thin. °Follow these instructions at home: °· Take over-the-counter and prescription medicines only as told by your health care provider. °· Keep up with your usual exercises and follow other instructions from your health care provider. °· Eat and drink lightly if you think you are going into labor. °· If Braxton Hicks contractions are making you uncomfortable: °? Change your position from lying down or resting to walking, or change from walking to resting. °? Sit and rest in a tub of warm water. °? Drink enough fluid to keep your urine pale yellow. Dehydration may cause these contractions. °? Do slow and deep breathing several times an hour. °· Keep all follow-up prenatal visits as told by your health care provider. This is important. °Contact a health care provider if: °· You have a fever. °· You have continuous pain in your abdomen. °Get help right away if: °· Your contractions become stronger, more regular, and closer together. °· You have fluid leaking or gushing from your vagina. °· You pass blood-tinged mucus (bloody show). °· You have bleeding from your vagina. °· You have low back pain that you never had before. °· You feel your baby’s head pushing down and causing pelvic pressure. °· Your baby is not moving inside you as much as it used to. °Summary °· Contractions that occur before labor are called Braxton   Hicks contractions, false labor, or practice contractions. °· Braxton Hicks contractions are usually shorter, weaker, farther apart, and less regular than true labor contractions. True labor contractions usually become progressively stronger and regular and they become more frequent. °· Manage discomfort from Braxton Hicks contractions by  changing position, resting in a warm bath, drinking plenty of water, or practicing deep breathing. °This information is not intended to replace advice given to you by your health care provider. Make sure you discuss any questions you have with your health care provider. °Document Released: 12/04/2016 Document Revised: 12/04/2016 Document Reviewed: 12/04/2016 °Elsevier Interactive Patient Education © 2018 [employer] ° °

## 2018-02-19 NOTE — Anesthesia Preprocedure Evaluation (Signed)
Anesthesia Evaluation  Patient identified by MRN, date of birth, ID band Patient awake    Reviewed: Allergy & Precautions, NPO status , Patient's Chart, lab work & pertinent test results  History of Anesthesia Complications (+) history of anesthetic complications  Airway Mallampati: II  TM Distance: >3 FB Neck ROM: Full    Dental no notable dental hx. (+) Teeth Intact   Pulmonary neg pulmonary ROS,    Pulmonary exam normal breath sounds clear to auscultation       Cardiovascular negative cardio ROS Normal cardiovascular exam Rhythm:Regular Rate:Normal     Neuro/Psych negative neurological ROS  negative psych ROS   GI/Hepatic Neg liver ROS, GERD  ,  Endo/Other  negative endocrine ROS  Renal/GU negative Renal ROS  negative genitourinary   Musculoskeletal Hx/o scoliosis   Abdominal (+) - obese,   Peds  Hematology  (+) anemia ,   Anesthesia Other Findings   Reproductive/Obstetrics (+) Pregnancy Previous C/section x 2                             Anesthesia Physical Anesthesia Plan  ASA: II  Anesthesia Plan: Spinal   Post-op Pain Management:    Induction: Intravenous  PONV Risk Score and Plan: 4 or greater and Scopolamine patch - Pre-op, Dexamethasone, Ondansetron and Treatment may vary due to age or medical condition  Airway Management Planned: Natural Airway  Additional Equipment:   Intra-op Plan:   Post-operative Plan:   Informed Consent: I have reviewed the patients History and Physical, chart, labs and discussed the procedure including the risks, benefits and alternatives for the proposed anesthesia with the patient or authorized representative who has indicated his/her understanding and acceptance.     Plan Discussed with: CRNA and Surgeon  Anesthesia Plan Comments:         Anesthesia Quick Evaluation

## 2018-02-19 NOTE — Progress Notes (Signed)
I have personally reviewed and evaluated the BP results as part of my medical decision-making.  I agree with nurse's assessment and plan.   Brock BadHARLES A. HARPER MD 02-19-2018

## 2018-02-19 NOTE — H&P (Signed)
Melanie FootmanFlorence M Valentina Calderon is a 27 y.o. [phone] female IUP 37 6/7 weeks presenting with c/o ut ctx for the last several hours. Has had irregular ut ctx for the last several days. Ut ctx have become more intense and occurring every 1-2 minutes. Denies vaginal bleeding or LOF. + FM. Was evaluated yesterday with elevated BP, W/U negative. Presently denies HA, visual changes or epigastric tenderness.  [facility]. Unremarkable except for H/O c section x 2, and desires for repeat. Declines BTL.  OB History    Gravida  4   Para  2   Term  2   Preterm      AB  1   Living  2     SAB  1   TAB      Ectopic      Multiple      Live Births  2          Past Medical History:  Diagnosis Date  . Anemia   . Anemia   . Complication of anesthesia    problems getting numb  . Scoliosis    Past Surgical History:  Procedure Laterality Date  . CAESA    . CESAREAN SECTION     x2   Family History: family history includes Cancer in her maternal aunt, maternal grandmother, and mother; Diabetes in her father, maternal aunt, maternal uncle, and mother; Hypertension in her father, maternal aunt, maternal grandfather, maternal uncle, and mother; Kidney disease in her maternal aunt. Social History:  reports that she has never smoked. She has never used smokeless tobacco. She reports that she does not drink alcohol or use drugs.     Maternal Diabetes: No Genetic Screening: Normal Maternal Ultrasounds/Referrals: Normal Fetal Ultrasounds or other Referrals:  None Maternal Substance Abuse:  No Significant Maternal Medications:  None Significant Maternal Lab Results:  None Other Comments:  None  Review of Systems  Constitutional: Negative.   Eyes: Negative.   Respiratory: Negative.   Cardiovascular: Negative.   Gastrointestinal: Positive for abdominal pain.  Genitourinary: Negative.    History Dilation: 1.5 Effacement (%): 60 Station: -1 Exam by:: DCALLAWAY, RN  Blood pressure  (!) 132/92, pulse 73, temperature 98.2 F (36.8 C), resp. rate 18, height 5' 1.5" (1.562 m), weight 70.8 kg (156 lb), last menstrual period 05/30/2017, unknown if currently breastfeeding. Exam Physical Exam  Constitutional: She appears well-developed and well-nourished.  Cardiovascular: Normal rate, regular rhythm and normal heart sounds.  Respiratory: Effort normal and breath sounds normal.  GI: Soft. Bowel sounds are normal.  Gravid, S=D  Genitourinary: Vagina normal and uterus normal.  Musculoskeletal:  Trace edema, nl DTR's    Prenatal labs: ABO, Rh: O/Positive/-- (01/14 1546) Antibody: Negative (01/14 1546) Rubella: 1.18 (01/14 1546) RPR: Non Reactive (05/13 1015)  HBsAg: Negative (01/14 1546)  HIV: Non Reactive (05/13 1015)  GBS: Positive (07/03 1652)   Assessment/Plan: IUP 37 6/7 weeks Prior c section x 2 for repeat Active labor  Pt will be admitted for repeat LTCS.  The risks of cesarean section discussed with the patient included but were not limited to: bleeding which may require transfusion or reoperation; infection which may require antibiotics; injury to bowel, bladder, ureters or other surrounding organs; injury to the fetus; need for additional procedures including hysterectomy in the event of a life-threatening hemorrhage; placental abnormalities wth subsequent pregnancies, incisional problems, thromboembolic phenomenon and other postoperative/anesthesia complications. The patient concurred with the proposed plan, giving informed written consent for the procedure.  Anesthesia and OR aware. Preoperative prophylactic antibiotics and SCDs ordered on call to the OR.  To OR when ready.  Sonam Huelsmann L. Alysia Penna, MD     Hermina Staggers 02/19/2018, 11:20 PM

## 2018-02-20 ENCOUNTER — Inpatient Hospital Stay (HOSPITAL_COMMUNITY): Payer: Medicaid Other | Admitting: Anesthesiology

## 2018-02-20 ENCOUNTER — Encounter (HOSPITAL_COMMUNITY): Payer: Self-pay | Admitting: General Practice

## 2018-02-20 DIAGNOSIS — O34211 Maternal care for low transverse scar from previous cesarean delivery: Secondary | ICD-10-CM | POA: Diagnosis present

## 2018-02-20 DIAGNOSIS — O99824 Streptococcus B carrier state complicating childbirth: Secondary | ICD-10-CM | POA: Diagnosis present

## 2018-02-20 DIAGNOSIS — M419 Scoliosis, unspecified: Secondary | ICD-10-CM | POA: Diagnosis present

## 2018-02-20 DIAGNOSIS — Z3A37 37 weeks gestation of pregnancy: Secondary | ICD-10-CM | POA: Diagnosis not present

## 2018-02-20 DIAGNOSIS — Z9889 Other specified postprocedural states: Secondary | ICD-10-CM

## 2018-02-20 LAB — RPR: RPR: NONREACTIVE

## 2018-02-20 LAB — CBC
HCT: 40.8 % (ref 36.0–46.0)
HCT: 35.2 % — ABNORMAL LOW (ref 36.0–46.0)
HEMOGLOBIN: 13.8 g/dL (ref 12.0–15.0)
HEMOGLOBIN: 11.8 g/dL — AB (ref 12.0–15.0)
MCH: 30.3 pg (ref 26.0–34.0)
MCH: 30 pg (ref 26.0–34.0)
MCHC: 33.8 g/dL (ref 30.0–36.0)
MCHC: 33.5 g/dL (ref 30.0–36.0)
MCV: 89.7 fL (ref 78.0–100.0)
MCV: 89.6 fL (ref 78.0–100.0)
Platelets: 198 10*3/uL (ref 150–400)
Platelets: 158 10*3/uL (ref 150–400)
RBC: 4.55 MIL/uL (ref 3.87–5.11)
RBC: 3.93 MIL/uL (ref 3.87–5.11)
RDW: 14.9 % (ref 11.5–15.5)
RDW: 14.5 % (ref 11.5–15.5)
WBC: 7.9 10*3/uL (ref 4.0–10.5)
WBC: 10.2 10*3/uL (ref 4.0–10.5)

## 2018-02-20 LAB — TYPE AND SCREEN
ABO/RH(D): O POS
ANTIBODY SCREEN: NEGATIVE

## 2018-02-20 LAB — CREATININE, SERUM
CREATININE: 0.63 mg/dL (ref 0.44–1.00)
GFR calc Af Amer: >60 mL/min (ref >60)

## 2018-02-20 LAB — ABO/RH: ABO/RH(D): O POS

## 2018-02-20 MED ORDER — DIPHENHYDRAMINE HCL 25 MG PO CAPS
25.0000 mg | ORAL_CAPSULE | ORAL | Status: DC | PRN
  Administered 2018-02-20 – 2018-02-21 (×3): 25 mg via ORAL
  Filled 2018-02-20 (×3): qty 1

## 2018-02-20 MED ORDER — SENNOSIDES-DOCUSATE SODIUM 8.6-50 MG PO TABS
2.0000 | ORAL_TABLET | ORAL | Status: DC
  Administered 2018-02-20 – 2018-02-22 (×3): 2 via ORAL
  Filled 2018-02-20 (×3): qty 2

## 2018-02-20 MED ORDER — CEFAZOLIN SODIUM-DEXTROSE 2-4 GM/100ML-% IV SOLN
2.0000 g | INTRAVENOUS | Status: AC
  Administered 2018-02-20: 2 g via INTRAVENOUS
  Filled 2018-02-20: qty 100

## 2018-02-20 MED ORDER — OXYCODONE-ACETAMINOPHEN 5-325 MG PO TABS: 2.0000 | ORAL_TABLET | ORAL | Status: DC | PRN

## 2018-02-20 MED ORDER — IBUPROFEN 800 MG PO TABS: 800.0000 mg | ORAL_TABLET | Freq: Three times a day (TID) | ORAL | Status: DC

## 2018-02-20 MED ORDER — DIPHENHYDRAMINE HCL 50 MG/ML IJ SOLN: 12.5000 mg | INTRAMUSCULAR | Status: DC | PRN

## 2018-02-20 MED ORDER — IBUPROFEN 800 MG PO TABS
800.0000 mg | ORAL_TABLET | Freq: Three times a day (TID) | ORAL | Status: DC
  Administered 2018-02-20 – 2018-02-23 (×9): 800 mg via ORAL
  Filled 2018-02-20 (×8): qty 1

## 2018-02-20 MED ORDER — ONDANSETRON HCL 4 MG/2ML IJ SOLN
INTRAMUSCULAR | Status: DC | PRN
  Administered 2018-02-20: 4 mg via INTRAVENOUS

## 2018-02-20 MED ORDER — ONDANSETRON HCL 4 MG/2ML IJ SOLN: 4.0000 mg | Freq: Three times a day (TID) | INTRAMUSCULAR | Status: DC | PRN

## 2018-02-20 MED ORDER — SIMETHICONE 80 MG PO CHEW: 80.0000 mg | CHEWABLE_TABLET | ORAL | Status: DC | PRN

## 2018-02-20 MED ORDER — DIBUCAINE 1 % RE OINT: 1.0000 "application " | TOPICAL_OINTMENT | RECTAL | Status: DC | PRN

## 2018-02-20 MED ORDER — LACTATED RINGERS IV BOLUS
1000.0000 mL | Freq: Once | INTRAVENOUS | Status: AC
  Administered 2018-02-20: 1000 mL via INTRAVENOUS

## 2018-02-20 MED ORDER — KETOROLAC TROMETHAMINE 30 MG/ML IJ SOLN: 30.0000 mg | Freq: Four times a day (QID) | INTRAMUSCULAR | Status: AC | PRN

## 2018-02-20 MED ORDER — NALBUPHINE HCL 10 MG/ML IJ SOLN: 5.0000 mg | Freq: Once | INTRAMUSCULAR | Status: AC | PRN

## 2018-02-20 MED ORDER — BUPIVACAINE IN DEXTROSE 0.75-8.25 % IT SOLN
INTRATHECAL | Status: DC | PRN
  Administered 2018-02-20: 1.5 mg via INTRATHECAL

## 2018-02-20 MED ORDER — IBUPROFEN 800 MG PO TABS
800.0000 mg | ORAL_TABLET | Freq: Three times a day (TID) | ORAL | Status: DC
  Filled 2018-02-20: qty 1

## 2018-02-20 MED ORDER — DIPHENHYDRAMINE HCL 25 MG PO CAPS: 25.0000 mg | ORAL_CAPSULE | Freq: Four times a day (QID) | ORAL | Status: DC | PRN

## 2018-02-20 MED ORDER — ENOXAPARIN SODIUM 40 MG/0.4ML ~~LOC~~ SOLN
40.0000 mg | SUBCUTANEOUS | Status: DC
  Administered 2018-02-20 – 2018-02-22 (×3): 40 mg via SUBCUTANEOUS
  Filled 2018-02-20 (×3): qty 0.4

## 2018-02-20 MED ORDER — ZOLPIDEM TARTRATE 5 MG PO TABS: 5.0000 mg | ORAL_TABLET | Freq: Every evening | ORAL | Status: DC | PRN

## 2018-02-20 MED ORDER — FENTANYL CITRATE (PF) 100 MCG/2ML IJ SOLN
INTRAMUSCULAR | Status: DC | PRN
  Administered 2018-02-20: 20 ug via INTRATHECAL

## 2018-02-20 MED ORDER — LACTATED RINGERS IV SOLN
INTRAVENOUS | Status: DC | PRN
  Administered 2018-02-20: 01:00:00 via INTRAVENOUS

## 2018-02-20 MED ORDER — HYDROMORPHONE HCL 1 MG/ML IJ SOLN: 1.0000 mg | INTRAMUSCULAR | Status: DC | PRN

## 2018-02-20 MED ORDER — NALBUPHINE HCL 10 MG/ML IJ SOLN
5.0000 mg | INTRAMUSCULAR | Status: DC | PRN
  Administered 2018-02-20 – 2018-02-21 (×3): 5 mg via SUBCUTANEOUS
  Filled 2018-02-20 (×3): qty 1

## 2018-02-20 MED ORDER — ENOXAPARIN SODIUM 40 MG/0.4ML ~~LOC~~ SOLN: 40.0000 mg | SUBCUTANEOUS | Status: DC

## 2018-02-20 MED ORDER — PHENYLEPHRINE 8 MG IN D5W 100 ML (0.08MG/ML) PREMIX OPTIME
INJECTION | INTRAVENOUS | Status: DC | PRN
  Administered 2018-02-20: 50 ug/min via INTRAVENOUS

## 2018-02-20 MED ORDER — OXYTOCIN 10 UNIT/ML IJ SOLN
INTRAMUSCULAR | Status: DC | PRN
  Administered 2018-02-20: 40 [IU] via INTRAVENOUS

## 2018-02-20 MED ORDER — MENTHOL 3 MG MT LOZG: 1.0000 | LOZENGE | OROMUCOSAL | Status: DC | PRN

## 2018-02-20 MED ORDER — SODIUM CHLORIDE 0.9% FLUSH: 3.0000 mL | INTRAVENOUS | Status: DC | PRN

## 2018-02-20 MED ORDER — KETOROLAC TROMETHAMINE 30 MG/ML IJ SOLN
INTRAMUSCULAR | Status: AC
  Filled 2018-02-20: qty 1

## 2018-02-20 MED ORDER — NALBUPHINE HCL 10 MG/ML IJ SOLN
5.0000 mg | INTRAMUSCULAR | Status: DC | PRN
  Filled 2018-02-20: qty 1

## 2018-02-20 MED ORDER — SIMETHICONE 80 MG PO CHEW
80.0000 mg | CHEWABLE_TABLET | ORAL | Status: DC
  Administered 2018-02-20 – 2018-02-22 (×3): 80 mg via ORAL
  Filled 2018-02-20 (×3): qty 1

## 2018-02-20 MED ORDER — NALOXONE HCL 4 MG/10ML IJ SOLN
1.0000 ug/kg/h | INTRAVENOUS | Status: DC | PRN
  Filled 2018-02-20: qty 5

## 2018-02-20 MED ORDER — LACTATED RINGERS IV SOLN: INTRAVENOUS | Status: DC

## 2018-02-20 MED ORDER — NALOXONE HCL 0.4 MG/ML IJ SOLN: 0.4000 mg | INTRAMUSCULAR | Status: DC | PRN

## 2018-02-20 MED ORDER — MEPERIDINE HCL 25 MG/ML IJ SOLN: 6.2500 mg | INTRAMUSCULAR | Status: DC | PRN

## 2018-02-20 MED ORDER — OXYCODONE-ACETAMINOPHEN 5-325 MG PO TABS
1.0000 | ORAL_TABLET | ORAL | Status: DC | PRN
  Administered 2018-02-21 – 2018-02-23 (×4): 1 via ORAL
  Filled 2018-02-20 (×5): qty 1

## 2018-02-20 MED ORDER — FENTANYL CITRATE (PF) 100 MCG/2ML IJ SOLN
INTRAMUSCULAR | Status: AC
  Filled 2018-02-20: qty 2

## 2018-02-20 MED ORDER — SIMETHICONE 80 MG PO CHEW
80.0000 mg | CHEWABLE_TABLET | Freq: Three times a day (TID) | ORAL | Status: DC
  Administered 2018-02-20 – 2018-02-23 (×7): 80 mg via ORAL
  Filled 2018-02-20 (×7): qty 1

## 2018-02-20 MED ORDER — COCONUT OIL OIL
1.0000 "application " | TOPICAL_OIL | Status: DC | PRN
  Filled 2018-02-20: qty 120

## 2018-02-20 MED ORDER — KETOROLAC TROMETHAMINE 30 MG/ML IJ SOLN: 30.0000 mg | Freq: Four times a day (QID) | INTRAMUSCULAR | Status: DC

## 2018-02-20 MED ORDER — MORPHINE SULFATE (PF) 0.5 MG/ML IJ SOLN
INTRAMUSCULAR | Status: DC | PRN
  Administered 2018-02-20: .2 mg via INTRATHECAL

## 2018-02-20 MED ORDER — KETOROLAC TROMETHAMINE 30 MG/ML IJ SOLN
INTRAMUSCULAR | Status: DC | PRN
  Administered 2018-02-20: 30 mg via INTRAVENOUS

## 2018-02-20 MED ORDER — PRENATAL MULTIVITAMIN CH
1.0000 | ORAL_TABLET | Freq: Every day | ORAL | Status: DC
  Administered 2018-02-20 – 2018-02-22 (×3): 1 via ORAL
  Filled 2018-02-20 (×3): qty 1

## 2018-02-20 MED ORDER — NALBUPHINE HCL 10 MG/ML IJ SOLN
5.0000 mg | Freq: Once | INTRAMUSCULAR | Status: AC | PRN
  Administered 2018-02-20: 5 mg via INTRAVENOUS

## 2018-02-20 MED ORDER — FENTANYL CITRATE (PF) 100 MCG/2ML IJ SOLN
25.0000 ug | INTRAMUSCULAR | Status: DC | PRN
  Administered 2018-02-20: 50 ug via INTRAVENOUS

## 2018-02-20 MED ORDER — WITCH HAZEL-GLYCERIN EX PADS: 1.0000 "application " | MEDICATED_PAD | CUTANEOUS | Status: DC | PRN

## 2018-02-20 MED ORDER — OXYTOCIN 40 UNITS IN LACTATED RINGERS INFUSION - SIMPLE MED: 2.5000 [IU]/h | INTRAVENOUS | Status: AC

## 2018-02-20 MED ORDER — TETANUS-DIPHTH-ACELL PERTUSSIS 5-2.5-18.5 LF-MCG/0.5 IM SUSP: 0.5000 mL | Freq: Once | INTRAMUSCULAR | Status: DC

## 2018-02-20 NOTE — Anesthesia Procedure Notes (Signed)
Spinal  Patient location during procedure: OR Start time: 02/20/2018 12:57 AM Staffing Anesthesiologist: Mal AmabileFoster, Tramon Crescenzo, MD Performed: anesthesiologist  Preanesthetic Checklist Completed: patient identified, site marked, surgical consent, pre-op evaluation, timeout performed, IV checked, risks and benefits discussed and monitors and equipment checked Spinal Block Patient position: sitting Prep: site prepped and draped and DuraPrep Patient monitoring: heart rate, cardiac monitor, continuous pulse ox and blood pressure Approach: midline Location: L3-4 Injection technique: single-shot Needle Needle type: Sprotte  Needle gauge: 24 G Needle length: 9 cm Needle insertion depth: 6 cm Assessment Sensory level: T4 Additional Notes Patient tolerated procedure well. Adequate sensory level.

## 2018-02-20 NOTE — Anesthesia Postprocedure Evaluation (Signed)
Anesthesia Post Note  Patient: Marry GuanFlorence M Cassis  Procedure(s) Performed: REPEAT CESAREAN SECTION (canceled)     Patient location during evaluation: Mother Baby Anesthesia Type: Spinal Level of consciousness: awake and alert Pain management: pain level not controlled Vital Signs Assessment: post-procedure vital signs reviewed and stable Respiratory status: spontaneous breathing Cardiovascular status: stable Postop Assessment: no headache, patient able to bend at knees, no backache, no apparent nausea or vomiting, adequate PO intake, spinal receding and able to ambulate Anesthetic complications: no    Last Vitals:  Vitals:   02/20/18 0631 02/20/18 0730  BP: 126/80   Pulse: (!) 57   Resp:  18  Temp: 36.7 C (!) 36.4 C  SpO2: 100% 97%    Last Pain:  Vitals:   02/20/18 0730  TempSrc: Oral  PainSc: 0-No pain   Pain Goal: Patients Stated Pain Goal: 2 (02/20/18 0400)               Salome ArntSterling, Zemira Zehring Marie

## 2018-02-20 NOTE — Progress Notes (Signed)
Kim CNM was notified of patient's occluded IV.  RN asked if she wanted to start a new IV since the patient was so soon after surgery.  She stated that as long as patient was tolerating POs she was fine with no IV.  RN informed patient that she needed to drink a lot of water to stay hydrated so we did not have to start a new IV. Patient was also assured that all medications that we give can be given orally.    Rhian Funari, Iraq[place]yd[place] N

## 2018-02-20 NOTE — Op Note (Signed)
Cesarean Section Procedure Note  02/19/2018 - 02/20/2018  1:52 AM  PATIENT:  Melanie Calderon  27 y.o. female  PRE-OPERATIVE DIAGNOSIS:  IUP 37 6/7 weeks, Active labor, Prior c section x 2, Desire for repaet  POST-OPERATIVE DIAGNOSIS:  SAA, plus viable female infant, nuchal cord x 1 and light meconium stained fluid  PROCEDURE:  Procedure(s): REPEAT CESAREAN SECTION (N/A)  SURGEON:  Surgeon(s) and Role:    * Hermina StaggersErvin, Afton Lavalle L, MD - Primary  ASSISTANTS: none   ANESTHESIA:   spinal  EBL:  Total I/O In: 1000 [I.V.:1000] Out: 515 [Blood:515]  BLOOD ADMINISTERED:none  DRAINS: none   LOCAL MEDICATIONS USED:  NONE  SPECIMEN:  Source of Specimen:  uterus  DISPOSITION OF SPECIMEN:  PATHOLOGY   Procedure Details   The patient was seen in the Holding Room. The risks, benefits, complications, treatment options, and expected outcomes were discussed with the patient.  The patient concurred with the proposed plan, giving informed consent.  The site of surgery properly noted/marked. The patient was taken to Operating Room # 9, identified as Melanie GuanFlorence M Lust and the procedure verified as C-Section Delivery. A Time Out was held and the above information confirmed.  After induction of anesthesia, the patient was draped and prepped in the usual sterile manner. A Pfannenstiel incision was made and carried down through the subcutaneous tissue to the fascia. Fascial incision was made and extended transversely. The fascia was separated from the underlying rectus tissue superiorly and inferiorly. The peritoneum was identified and entered. Peritoneal incision was extended longitudinally. The utero-vesical peritoneal reflection was incised transversely and the bladder flap was bluntly freed from the lower uterine segment. A low transverse uterine incision was made. Delivered from cephalic presentation was a Viable female infant with Apgars and weight as recorded. Nuchal cord x 1 was easily reduced .  After the umbilical cord was clamped and cut cord blood was obtained for evaluation. The placenta was removed intact and appeared normal. The uterine outline, tubes and ovaries appeared normal. The uterine incision was closed with running locked sutures of Chromic in a locking fashion. A second layer of same suture was used to complete a 2 layer closure.  Hemostasis was observed. Lavage was carried out until clear. The perineum and abd muscles were reapproximated with 2/0 Vicryl. The fascia was then reapproximated with running sutures of Vicryl. The skin was reapproximated with Vicryl.  Instrument, sponge, and needle counts were correct prior the abdominal closure and at the conclusion of the case.   Complications:  None; patient tolerated the procedure well.  COUNTS:  YES  PLAN OF CARE: Admit to inpatient   PATIENT DISPOSITION:  PACU - hemodynamically stable.   Delay start of Pharmacological VTE agent (>24hrs) due to surgical blood loss or risk of bleeding: not applicable             Disposition: PACU - hemodynamically stable.         Condition: stable   Hermina StaggersErvin, Hezikiah Retzloff L, MD 02/20/2018 1:52 AM

## 2018-02-20 NOTE — Lactation Note (Signed)
This note was copied from a baby's chart. Lactation Consultation Note  Patient Name: Melanie Marland KitchenFlorence Calderon Today's Date: 02/20/2018 Reason for consult: Initial assessment;Early term [id number]  17 hours old early term female who is being exclusively BF by her mother, she's a P3 but not very experienced BF. She was able to BF her other children only for a couple of days while at the hospital but her plans for this baby is to do both, breast and formula. Explained to mom the benefits of delaying bottles and pacifiers until BF is well established at 3-4 weeks post-partum.   Mom already knows how to hand express and she's been able to see colostrum. Assisted with latch, baby was already nursing when entering the room, and doing STS with mom after her bath but latch was somehow shallow, mom nursing baby in cradle position, advised cross cradle; mom followed through, baby had a big wide open mouth, some audible swallows were noted.   Mom doesn't have a pump at home, [facility] hospital pump instructions, cleaning and storage were reviewed. Encouraged mom to feed baby 8-12 times/24 hours or sooner if feeding cues are present. If baby is not cueing in a 3 hour period, mom will wake her up to feed. Reviewed BF brochure, BF resources and feeding diary, mom is aware of LC services and will call PRN.  Maternal Data Formula Feeding for Exclusion: No Has patient been taught Hand Expression?: Yes Does the patient have breastfeeding experience prior to this delivery?: Yes  Feeding Feeding Type: Breast Fed Length of feed: 15 min  LATCH Score Latch: Grasps breast easily, tongue down, lips flanged, rhythmical sucking.  Audible Swallowing: A few with stimulation  Type of Nipple: Everted at rest and after stimulation  Comfort (Breast/Nipple): Soft / non-tender  Hold (Positioning): Assistance needed to correctly position infant at breast and maintain latch.  LATCH Score:  8  Interventions Interventions: Breast feeding basics reviewed;Assisted with latch;Skin to skin;Breast massage;Breast compression;Adjust position;Hand pump;Support pillows  Lactation Tools Discussed/Used Tools: Pump Breast pump type: Manual WIC Program: No Pump Review: Setup, frequency, and cleaning;Milk Storage Initiated by:: MPeck Date initiated:: 02/20/18   Consult Status Consult Status: Follow-up Date: 02/21/18 Follow-up type: In-patient    Madisin Hasan Venetia ConstableS Linea Calles 02/20/2018, 6:47 PM

## 2018-02-20 NOTE — Transfer of Care (Signed)
Immediate Anesthesia Transfer of Care Note  Patient: Melanie Calderon  Procedure(s) Performed: REPEAT CESAREAN SECTION (N/A )  Patient Location: PACU  Anesthesia Type:Spinal  Level of Consciousness: awake  Airway & Oxygen Therapy: Patient Spontanous Breathing  Post-op Assessment: Report given to RN and Post -op Vital signs reviewed and stable  Post vital signs: stable  Last Vitals:  Vitals Value Taken Time  BP 118/63 02/20/2018  2:15 AM  Temp 37.2 C 02/20/2018  2:15 AM  Pulse 69 02/20/2018  2:17 AM  Resp 16 02/20/2018  2:17 AM  SpO2 97 % 02/20/2018  2:17 AM  Vitals shown include unvalidated device data.  Last Pain:  Vitals:   02/20/18 0030  TempSrc:   PainSc: 10-Worst pain ever         Complications: No apparent anesthesia complications

## 2018-02-20 NOTE — Anesthesia Postprocedure Evaluation (Signed)
Anesthesia Post Note  Patient: Melanie Calderon  Procedure(s) Performed: REPEAT CESAREAN SECTION (N/A )     Patient location during evaluation: PACU Anesthesia Type: Spinal Level of consciousness: oriented and awake and alert Pain management: pain level controlled Vital Signs Assessment: post-procedure vital signs reviewed and stable Respiratory status: spontaneous breathing, respiratory function stable and nonlabored ventilation Cardiovascular status: blood pressure returned to baseline and stable Postop Assessment: no headache, no backache, no apparent nausea or vomiting, patient able to bend at knees and spinal receding Anesthetic complications: no    Last Vitals:  Vitals:   02/20/18 0300 02/20/18 0315  BP: 117/81 109/67  Pulse: (!) 58 60  Resp: 12 (!) 25  Temp: 37.2 C   SpO2: 100% 100%    Last Pain:  Vitals:   02/20/18 0300  TempSrc: Oral  PainSc: 0-No pain   Pain Goal:                 Jashira Cotugno A.

## 2018-02-21 MED ORDER — HYPROMELLOSE (GONIOSCOPIC) 2.5 % OP SOLN: 1.0000 [drp] | OPHTHALMIC | Status: DC | PRN

## 2018-02-21 MED ORDER — ACETAMINOPHEN 325 MG PO TABS
650.0000 mg | ORAL_TABLET | Freq: Four times a day (QID) | ORAL | Status: DC | PRN
  Administered 2018-02-21: 650 mg via ORAL
  Filled 2018-02-21: qty 2

## 2018-02-21 MED ORDER — POLYVINYL ALCOHOL 1.4 % OP SOLN
1.0000 [drp] | OPHTHALMIC | Status: DC | PRN
Start: 2018-02-21 — End: 2018-02-23
  Administered 2018-02-21: 1 [drp] via OPHTHALMIC
  Filled 2018-02-21: qty 15

## 2018-02-21 NOTE — Progress Notes (Signed)
Subjective: Postpartum Day 1: Cesarean Delivery Patient reports incisional pain, tolerating PO and no problems voiding.    Objective: Vital signs in last 24 hours: Temp:  [97.8 F (36.6 C)-98.5 F (36.9 C)] 97.8 F (36.6 C) (07/21 0700) Pulse Rate:  [61-66] 64 (07/21 0700) Resp:  [18-20] 20 (07/21 0700) BP: (121-143)/(80-86) 122/86 (07/21 0700) SpO2:  [98 %-100 %] 100 % (07/21 0700)  Physical Exam:  General: alert, cooperative and no distress Lochia: appropriate Uterine Fundus: firm Incision: healing well, no significant drainage, no dehiscence DVT Evaluation: No evidence of DVT seen on physical exam.  Recent Labs    02/19/18 2246 02/20/18 0609  HGB 13.8 11.8*  HCT 40.8 35.2*    Assessment/Plan: Status post Cesarean section. Doing well postoperatively.  Continue current care.  Wynelle BourgeoisMarie Michaeljohn Calderon 02/21/2018, 10:02 AM
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
## 2018-02-22 NOTE — Progress Notes (Signed)
Patient ID: Melanie Calderon, female   DOB: 1990/11/30, 27 y.o.   MRN: [id number]  POSTPARTUM PROGRESS NOTE  Post Operative Day 2 Subjective:  Melanie Calderon is a 27 y.o. [id number] 7657w0d s/p c section..  No acute events overnight. She complained off blurry vision yesterday that resolved with eye drops and abdominal pain, but not today.  Pt denies problems with ambulating, voiding or po intake.  She denies nausea or vomiting.  Pain is moderately controlled.  She has not had flatus. She has not had bowel movement.  Lochia Moderate.   Objective: Blood pressure 137/89, pulse 60, temperature 98.6 F (37 C), temperature source Oral, resp. rate 18, height 5' 1.5" (1.562 m), weight 70.8 kg (156 lb), last menstrual period 05/30/2017, SpO2 98 %, unknown if currently breastfeeding.  Physical Exam:  General: alert, cooperative and no distress Chest: no respiratory distress Incision: no sign of infection. No bleeding.  Abdomen: soft, appropriately tender. ,  Uterine Fundus: firm, appropriately tender DVT Evaluation: No calf swelling or tenderness Extremities: minimal edema  Recent Labs    02/19/18 2246 02/20/18 0609  HGB 13.8 11.8*  HCT 40.8 35.2*    Assessment/Plan:  ASSESSMENT: Melanie Calderon is a 27 y.o. [id number] 7857w0d s/p repeat c/section. Patient currently doing well.     Plan for discharge tomorrow, Breastfeeding and Contraception POP   LOS: 3 days   Jackelyn KnifeDaniel K OlsonMD 02/22/2018, 11:40 AM

## 2018-02-23 MED ORDER — OXYCODONE-ACETAMINOPHEN 5-325 MG PO TABS: 1.0000 | ORAL_TABLET | Freq: Four times a day (QID) | ORAL | 0 refills | Status: DC | PRN

## 2018-02-23 MED ORDER — IBUPROFEN 600 MG PO TABS: 600.0000 mg | ORAL_TABLET | Freq: Four times a day (QID) | ORAL | 0 refills | Status: DC

## 2018-02-23 NOTE — Lactation Note (Signed)
This note was copied from a baby's chart. Lactation Consultation Note  Patient Name: Melanie Marland KitchenFlorence Calderon NUUVO'ZToday's Date: 02/23/2018 Reason for consult: Follow-up assessment;Early term 4737-38.6wks Mom states baby is latching well.  Milk is coming in and breasts are becoming full.  Observed baby latch easily to breast and feed actively.  Discussed milk coming to volume and the prevention and treatment of engorgement.  Manual pump given with instructions.  Answered questions.  Lactation outpatient services reviewed and encouraged prn.  Maternal Data    Feeding Feeding Type: Breast Fed  LATCH Score Latch: Grasps breast easily, tongue down, lips flanged, rhythmical sucking.  Audible Swallowing: Spontaneous and intermittent  Type of Nipple: Everted at rest and after stimulation  Comfort (Breast/Nipple): Soft / non-tender  Hold (Positioning): No assistance needed to correctly position infant at breast.  LATCH Score: 10  Interventions Interventions: Breast feeding basics reviewed  Lactation Tools Discussed/Used     Consult Status Consult Status: Follow-up Follow-up type: Call as needed    Huston FoleyMOULDEN, Christabelle Hanzlik S 02/23/2018, 9:14 AM

## 2018-02-23 NOTE — Discharge Summary (Signed)
OB Discharge Summary     Patient Name: Melanie Calderon DOB: [birth year] MRN: [id number]  Date of admission: 02/19/2018 Delivering MD: Hermina Staggers   Date of discharge: 02/23/2018  Admitting diagnosis: CTX Intrauterine pregnancy: [redacted]w[redacted]d     Secondary diagnosis:  Active Problems:   Post-operative state  Additional problems:  no other problems addressed during this admission     Discharge diagnosis: Term Pregnancy Delivered                                                                                                Post partum procedures: none  Augmentation: Admitted in active labor for repeat LTCS; no augmentation utilized.   Complications: None  Hospital course:  Spontaneous onset of labor w. Admission for repeat LTCS  Physical exam  Vitals:   02/22/18 1353 02/22/18 2216 02/23/18 0525 02/23/18 0526  BP: 126/80 113/74 (!) 134/92 (!) 129/92  Pulse: 66 69 64 (!) 56  Resp: 20 20    Temp: 98.9 F (37.2 C) 98.4 F (36.9 C) 97.6 F (36.4 C)   TempSrc: Oral Oral Oral   SpO2:      Weight:      Height:       General: alert, cooperative and no distress Lochia: appropriate; pt reports bleeding is light Uterine Fundus: firm Incision: Dressing is clean, dry, and intact; honeycomb visualized DVT Evaluation: No evidence of DVT seen on physical exam.Legs are non tender bilaterally and there is mild +1 pitting edema present bilaterally Labs: Lab Results  Component Value Date   WBC 10.2 02/20/2018   HGB 11.8 (L) 02/20/2018   HCT 35.2 (L) 02/20/2018   MCV 89.6 02/20/2018   PLT 158 02/20/2018   CMP Latest Ref Rng & Units 02/20/2018  Glucose 70 - 99 mg/dL -  BUN 6 - 20 mg/dL -  Creatinine 4.09 - 8.11 mg/dL 9.14  Sodium 782 - 956 mmol/L -  Potassium 3.5 - 5.1 mmol/L -  Chloride 98 - 111 mmol/L -  CO2 22 - 32 mmol/L -  Calcium 8.9 - 10.3 mg/dL -  Total Protein 6.5 - 8.1 g/dL -  Total Bilirubin 0.3 - 1.2 mg/dL -  Alkaline Phos 38 - 213 U/L -  AST 15 - 41 U/L -  ALT  0 - 44 U/L -    Discharge instruction: per After Visit Summary and "Baby and Me Booklet"; n+ informative educational handout on signs/symptoms of Pre-E   Diet: routine diet  Activity: Advance as tolerated. Pelvic rest for 6 weeks.   Outpatient follow up:  6 weeks Follow up Appt: Future Appointments  Date Time Provider Department Center  03/23/2018  8:15 AM Hermina Staggers, MD [facility] None   Follow up Visit:No follow-ups on file.  Postpartum contraception: Progesterone only pills  Newborn Data: Live born female  Birth Weight: 6 lb 3.1 oz (2810 g) APGAR: 8, 9  Newborn Delivery   Birth date/time:  02/20/2018 01:19:00 Delivery type:  C-Section, Low Transverse Trial of labor:  No C-section categorization:  Repeat     Baby Feeding: Breast Disposition:home with mother  02/23/2018 Ronnie Dosshika I Chukwu, Medical Student

## 2018-02-23 NOTE — Discharge Instructions (Signed)

## 2018-02-24 ENCOUNTER — Encounter: Payer: Self-pay | Admitting: *Deleted

## 2018-02-25 ENCOUNTER — Encounter (HOSPITAL_COMMUNITY): Payer: Self-pay | Admitting: Obstetrics and Gynecology

## 2018-02-26 ENCOUNTER — Encounter: Payer: Medicaid Other | Admitting: Obstetrics

## 2018-02-26 ENCOUNTER — Encounter (HOSPITAL_COMMUNITY)
Admission: RE | Admit: 2018-02-26 | Discharge: 2018-02-26 | Disposition: A | Discharge status: Home / Self Care | Payer: Medicaid Other | Source: Ambulatory Visit

## 2018-02-26 HISTORY — DX: Other complications of anesthesia, initial encounter: T88.59XA

## 2018-02-26 HISTORY — DX: Adverse effect of unspecified anesthetic, initial encounter: T41.45XA

## 2018-02-28 ENCOUNTER — Inpatient Hospital Stay (HOSPITAL_COMMUNITY)
Admission: RE | Admit: 2018-02-28 | Payer: Medicaid Other | Source: Ambulatory Visit | Admitting: Obstetrics & Gynecology

## 2018-03-02 ENCOUNTER — Encounter (HOSPITAL_COMMUNITY): Payer: Self-pay | Admitting: *Deleted

## 2018-03-03 ENCOUNTER — Encounter: Payer: Self-pay | Admitting: Obstetrics and Gynecology

## 2018-03-03 ENCOUNTER — Ambulatory Visit (INDEPENDENT_AMBULATORY_CARE_PROVIDER_SITE_OTHER): Payer: Medicaid Other | Admitting: Obstetrics and Gynecology

## 2018-03-03 VITALS — BP 147/94 | HR 93 | Wt 146.0 lb

## 2018-03-03 DIAGNOSIS — O165 Unspecified maternal hypertension, complicating the puerperium: Secondary | ICD-10-CM | POA: Diagnosis not present

## 2018-03-03 DIAGNOSIS — Z3481 Encounter for supervision of other normal pregnancy, first trimester: Secondary | ICD-10-CM

## 2018-03-03 DIAGNOSIS — Z9889 Other specified postprocedural states: Secondary | ICD-10-CM

## 2018-03-03 DIAGNOSIS — O169 Unspecified maternal hypertension, unspecified trimester: Secondary | ICD-10-CM | POA: Insufficient documentation

## 2018-03-03 MED ORDER — ENALAPRIL MALEATE 2.5 MG PO TABS: 2.5000 mg | ORAL_TABLET | Freq: Every day | ORAL | 2 refills | Status: DC

## 2018-03-03 NOTE — Progress Notes (Signed)
Patient ID: Melanie Calderon, female   DOB: [birth year], 27 y.o.   MRN: [id number] Ms Valentina LucksGriffin presents for incision check. S/P RTCS on 02/20/18 Postpartum course unremarkable BP dys in 90's without S/Sx of PEC o/w. Today pt denies HA, visual changes or epigastric pain. Bleeding decreasing. No bowel or bladder dysfunction  PE AF BP's as recorded Lungs clear Heart RRR Abd soft + BS fundus U-3 incision healing well without S/Sx of infection   A/P Incision check        Elevated BP No S/Sx of PEC today. Will start Vasotec 2.5 mg po qd. Recheck BP in 1 week

## 2018-03-03 NOTE — Progress Notes (Signed)
Pt here for F/U incision check pt had C-section on 02/20/18. CC:per pt no complaints.

## 2018-03-10 ENCOUNTER — Ambulatory Visit: Payer: Medicaid Other | Admitting: *Deleted

## 2018-03-10 VITALS — BP 136/96 | HR 73 | Wt 137.0 lb

## 2018-03-10 DIAGNOSIS — Z0130 Encounter for examination of blood pressure without abnormal findings: Secondary | ICD-10-CM

## 2018-03-10 DIAGNOSIS — O165 Unspecified maternal hypertension, complicating the puerperium: Secondary | ICD-10-CM

## 2018-03-10 NOTE — Progress Notes (Signed)
Subjective:  Melanie Calderon is a 27 y.o. female here for BP check.   Hypertension ROS: taking medications as instructed, no medication side effects noted, no TIA's, no chest pain on exertion, no dyspnea on exertion and no swelling of ankles.     Objective:  BP (!) 136/96   Pulse 73   Wt 137 lb (62.1 kg)   BMI 25.47 kg/m   Appearance alert, well appearing, and in no distress. Pt states occasional HA since starting medication. General exam BP noted to be stable in  today in office.    Assessment:   Blood Pressure stable.   Plan: Per Dr Clearance CootsHarper- Current treatment plan is effective, no change in therapy.  Pt to follow up in office in 1 week for BP check or call if need to be seen sooner.

## 2018-03-10 NOTE — Progress Notes (Signed)
I reviewed the nurses note and agree with the plan of care.   Harper, Charles A, MD 10/30/2017 10:46 AM  

## 2018-03-16 ENCOUNTER — Ambulatory Visit: Payer: Medicaid Other

## 2018-03-16 VITALS — BP 128/89 | HR 95 | Wt 134.4 lb

## 2018-03-16 DIAGNOSIS — G44211 Episodic tension-type headache, intractable: Secondary | ICD-10-CM

## 2018-03-16 MED ORDER — BUTALBITAL-APAP-CAFFEINE 50-325-40 MG PO TABS: 2.0000 | ORAL_TABLET | Freq: Four times a day (QID) | ORAL | 2 refills | Status: DC | PRN

## 2018-03-16 NOTE — Progress Notes (Signed)
Subjective:  Melanie Calderon is a 27 y.o. female here for BP check.   Hypertension ROS: taking medications as instructed, no medication side effects noted, no TIA's, no chest pain on exertion, no dyspnea on exertion and no swelling of ankles.    Objective:  There were no vitals taken for this visit.  Appearance alert, well appearing, and in no distress. General exam BP noted to be well controlled today in office.    Assessment:   Blood Pressure well controlled.   Plan:  Current treatment plan is effective, no change in therapy.Fiorocet for headache, return in 1 week for PP visit.

## 2018-03-16 NOTE — Progress Notes (Signed)
I have reviewed the chart and agree with nursing staff's documentation of this patient's encounter.  Coral Ceoharles Harper, MD 03/16/2018 4:01 PM

## 2018-03-23 ENCOUNTER — Ambulatory Visit (INDEPENDENT_AMBULATORY_CARE_PROVIDER_SITE_OTHER): Payer: Medicaid Other | Admitting: Obstetrics and Gynecology

## 2018-03-23 ENCOUNTER — Encounter: Payer: Self-pay | Admitting: Obstetrics and Gynecology

## 2018-03-23 DIAGNOSIS — I10 Essential (primary) hypertension: Secondary | ICD-10-CM | POA: Insufficient documentation

## 2018-03-23 DIAGNOSIS — Z30011 Encounter for initial prescription of contraceptive pills: Secondary | ICD-10-CM | POA: Insufficient documentation

## 2018-03-23 MED ORDER — ENALAPRIL MALEATE 5 MG PO TABS: 5.0000 mg | ORAL_TABLET | Freq: Every day | ORAL | 5 refills | Status: DC

## 2018-03-23 MED ORDER — NORETHINDRONE 0.35 MG PO TABS: 1.0000 | ORAL_TABLET | Freq: Every day | ORAL | 11 refills | Status: DC

## 2018-03-23 NOTE — Progress Notes (Signed)
Post Partum Exam  Melanie Calderon is a 27 y.o. [phone] female who presents for a postpartum visit. She is 4 weeks postpartum following a low cervical transverse Cesarean section. I have fully reviewed the prenatal and intrapartum course. The delivery was at 38 gestational weeks.  Anesthesia: epidural and spinal. Postpartum course has been doing well. Baby's course has been doing well. Baby is feeding by breast. Bleeding thin lochia. Bowel function is normal. Bladder function is abnormal: uncomfortable when urinating. Patient is not sexually active. Contraception method is abstinence. Discuss BC options. Postpartum depression screening:neg   Last pap smear done 08/2017 and was Normal  Review of Systems Pertinent items noted in HPI and remainder of comprehensive ROS otherwise negative.    Objective:  currently breastfeeding.  General:  alert   Breasts:  deferred  Lungs: clear to auscultation bilaterally  Heart:  regular rate and rhythm, S1, S2 normal, no murmur, click, rub or gallop  Abdomen: soft, non-tender; bowel sounds normal; no masses,  no organomegaly, incision well healed   Vulva:  not evaluated  Vagina: not evaluated  Cervix:  not examined  Corpus: not examined  Adnexa:  not evaluated  Rectal Exam: Not performed.        Assessment:    Nl postpartum exam.   Hypertension  Contraception  Plan:   1. Contraception: oral progesterone-only contraceptive 2. Return to normal ADL's 3. Will increase Vasotec to 5 mg. List of PCP's provided to pt. 3. Follow up in: 12 months or as needed.

## 2018-03-23 NOTE — Patient Instructions (Signed)

## 2018-08-03 ENCOUNTER — Telehealth: Payer: Self-pay

## 2018-08-03 NOTE — Telephone Encounter (Signed)
Returned call, left vm.

## 2018-08-10 ENCOUNTER — Telehealth: Payer: Self-pay | Admitting: *Deleted

## 2018-08-10 NOTE — Telephone Encounter (Signed)
Pt called to office to verify if paperwork for her job is in chart, she needs copy.  Attempt to return call. No answer, LM on VM.

## 2018-08-10 NOTE — Telephone Encounter (Signed)
Pt called to office to see if certain paperwork was in her chart. Pt is looking for form that states return to work/physical form.  Pt made aware only forms that are scanned in her chart are FMLA forms.  I see no physical form for work in chart.

## 2018-10-15 ENCOUNTER — Other Ambulatory Visit: Payer: Self-pay

## 2018-10-15 ENCOUNTER — Ambulatory Visit (INDEPENDENT_AMBULATORY_CARE_PROVIDER_SITE_OTHER): Payer: Medicaid Other | Admitting: Medical

## 2018-10-15 ENCOUNTER — Encounter: Payer: Self-pay | Admitting: Medical

## 2018-10-15 VITALS — BP 124/87 | HR 121 | Wt 122.0 lb

## 2018-10-15 DIAGNOSIS — Z309 Encounter for contraceptive management, unspecified: Secondary | ICD-10-CM

## 2018-10-15 DIAGNOSIS — Z30430 Encounter for insertion of intrauterine contraceptive device: Secondary | ICD-10-CM

## 2018-10-15 DIAGNOSIS — Z3202 Encounter for pregnancy test, result negative: Secondary | ICD-10-CM

## 2018-10-15 LAB — POCT URINE PREGNANCY: PREG TEST UR: NEGATIVE

## 2018-10-15 MED ORDER — LEVONORGESTREL 20 MCG/24HR IU IUD
INTRAUTERINE_SYSTEM | Freq: Once | INTRAUTERINE | Status: AC
  Administered 2018-10-15: 10:00:00 via INTRAUTERINE

## 2018-10-15 NOTE — Progress Notes (Signed)
Patient ID: Melanie Calderon, female   DOB: Dec 28, 1990, 28 y.o.   MRN: [id number]    GYNECOLOGY CLINIC PROCEDURE NOTE  Melanie Calderon is a 28 y.o. [phone] here for Mirena IUD insertion. No GYN concerns.  Last pap smear was on 08/17/2017 and was normal.  IUD Insertion Procedure Note Patient identified, informed consent performed.  Discussed risks of irregular bleeding, cramping, infection, malpositioning or misplacement of the IUD outside the uterus which may require further procedure such as laparoscopy. Time out was performed.  Urine pregnancy test negative.  Speculum placed in the vagina.  Cervix visualized.  Cleaned with Betadine x 2.  Grasped anteriorly with a single tooth tenaculum.  Uterus sounded to 8 cm.  Mirena IUD placed per manufacturer's recommendations.  Strings trimmed to 3 cm. Tenaculum was removed, good hemostasis noted.  Patient tolerated procedure well.   Patient was given post-procedure instructions.  She was advised to be have backup contraception for one week.  Patient was also asked to check IUD strings periodically and follow up in 4 weeks for IUD check. IUD was placed by Dr. Alysia Penna after I had difficulty passing through the internal cervical os.   Marny Lowenstein, PA-C 10/15/2018 10:07 AM

## 2018-10-15 NOTE — Progress Notes (Signed)
RGYN pt here to discuss contraception. Pt is interested in Mirena IUD  No unprotected intercourse in the past 14 days.  Pt states cycles are heavy  LMP: 10/14/18 Last pap: 08/17/2017  Pt declines pap today due to being on heavy cycle.

## 2018-10-15 NOTE — Patient Instructions (Signed)
Intrauterine Device Insertion, Care After  This sheet gives you information about how to care for yourself after your procedure. Your health care provider may also give you more specific instructions. If you have problems or questions, contact your health care provider. What can I expect after the procedure? After the procedure, it is common to have:  Cramps and pain in the abdomen.  Light bleeding (spotting) or heavier bleeding that is like your menstrual period. This may last for up to a few days.  Lower back pain.  Dizziness.  Headaches.  Nausea. Follow these instructions at home:  Before resuming sexual activity, check to make sure that you can feel the IUD string(s). You should be able to feel the end of the string(s) below the opening of your cervix. If your IUD string is in place, you may resume sexual activity. ? If you had a hormonal IUD inserted more than 7 days after your most recent period started, you will need to use a backup method of birth control for 7 days after IUD insertion. Ask your health care provider whether this applies to you.  Continue to check that the IUD is still in place by feeling for the string(s) after every menstrual period, or once a month.  Take over-the-counter and prescription medicines only as told by your health care provider.  Do not drive or use heavy machinery while taking prescription pain medicine.  Keep all follow-up visits as told by your health care provider. This is important. Contact a health care provider if:  You have bleeding that is heavier or lasts longer than a normal menstrual cycle.  You have a fever.  You have cramps or abdominal pain that get worse or do not get better with medicine.  You develop abdominal pain that is new or is not in the same area of earlier cramping and pain.  You feel lightheaded or weak.  You have abnormal or bad-smelling discharge from your vagina.  You have pain during sexual activity.   You have any of the following problems with your IUD string(s): ? The string bothers or hurts you or your sexual partner. ? You cannot feel the string. ? The string has gotten longer.  You can feel the IUD in your vagina.  You think you may be pregnant, or you miss your menstrual period.  You think you may have an STI (sexually transmitted infection). Get help right away if:  You have flu-like symptoms.  You have a fever and chills.  You can feel that your IUD has slipped out of place. Summary  After the procedure, it is common to have cramps and pain in the abdomen. It is also common to have light bleeding (spotting) or heavier bleeding that is like your menstrual period.  Continue to check that the IUD is still in place by feeling for the string(s) after every menstrual period, or once a month.  Keep all follow-up visits as told by your health care provider. This is important.  Contact your health care provider if you have problems with your IUD string(s), such as the string getting longer or bothering you or your sexual partner. This information is not intended to replace advice given to you by your health care provider. Make sure you discuss any questions you have with your health care provider. Document Released: 03/19/2011 Document Revised: 06/11/2016 Document Reviewed: 06/11/2016 Elsevier Interactive Patient Education  2019 [employer]. IUD PLACEMENT POST-PROCEDURE INSTRUCTIONS  1. You may take Ibuprofen, Aleve or Tylenol  for pain if needed.  Cramping should resolve within in 24 hours.  2. You may have a small amount of spotting.  You should wear a mini pad for the next few days.  3. You may have intercourse after 24 hours.  If you using this for birth control, it is effective immediately.  4. You need to call if you have any pelvic pain, fever, heavy bleeding or foul smelling vaginal discharge.  Irregular bleeding is common the first several months after having an IUD  placed. You do not need to call for this reason unless you are concerned.  5. Shower or bathe as normal  6. You should have a follow-up appointment in 4-8 weeks for a re-check to make sure you are not having any problems.

## 2018-11-08 ENCOUNTER — Other Ambulatory Visit: Payer: Self-pay

## 2018-11-08 DIAGNOSIS — N898 Other specified noninflammatory disorders of vagina: Secondary | ICD-10-CM

## 2018-11-08 DIAGNOSIS — R300 Dysuria: Secondary | ICD-10-CM

## 2018-11-08 MED ORDER — METRONIDAZOLE 500 MG PO TABS: 500.0000 mg | ORAL_TABLET | Freq: Two times a day (BID) | ORAL | 0 refills | Status: DC

## 2018-11-08 MED ORDER — NITROFURANTOIN MONOHYD MACRO 100 MG PO CAPS: 100.0000 mg | ORAL_CAPSULE | Freq: Two times a day (BID) | ORAL | 0 refills | Status: AC

## 2018-11-08 NOTE — Progress Notes (Signed)
Patient called and states that she is having dysuria for a couple of weeks that is getting worse, pt also reports she has a foul smelling discharge. Rx for Flagyl and Macrobid sent per protocol- pt verbalized understanding.

## 2018-11-10 ENCOUNTER — Other Ambulatory Visit: Payer: Self-pay

## 2018-11-10 ENCOUNTER — Ambulatory Visit (INDEPENDENT_AMBULATORY_CARE_PROVIDER_SITE_OTHER): Payer: Medicaid Other | Admitting: Obstetrics and Gynecology

## 2018-11-10 ENCOUNTER — Encounter: Payer: Self-pay | Admitting: Obstetrics and Gynecology

## 2018-11-10 VITALS — BP 130/78 | HR 85 | Wt 125.0 lb

## 2018-11-10 DIAGNOSIS — Z30431 Encounter for routine checking of intrauterine contraceptive device: Secondary | ICD-10-CM

## 2018-11-10 NOTE — Progress Notes (Signed)
Patient ID: Melanie Calderon, female   DOB: 02/17/1991, 28 y.o.   MRN: [id number] Ms Griesmer presents for IUD check. Mirena inserted 10/15/18 Has not been sexual active since insertion Irregular spotting since insertion Currently being treated for vaginal discharge and dysuria, taking Flagyl and Macrobid Reports Sx are better Last pap 4/19, normal  PE AF VSS Lungs clear Heart RRR Abd soft + BS GU nl EGBUS min vaginal spotting noted, IUD string at cervical os Uterus small mobile non tender no adnexal masses or tenderness  A/P IUD check Pt reassured in regards to spotting. Completed medications as prescribed. F/U PRN.

## 2018-11-10 NOTE — Patient Instructions (Signed)

## 2020-07-19 ENCOUNTER — Telehealth: Payer: Self-pay

## 2020-07-19 NOTE — Telephone Encounter (Signed)
Telephoned patient at home number. Left a voice message with BCCCP contact information. 

## 2020-07-23 ENCOUNTER — Other Ambulatory Visit: Payer: Self-pay

## 2020-07-23 DIAGNOSIS — N6310 Unspecified lump in the right breast, unspecified quadrant: Secondary | ICD-10-CM

## 2020-07-26 ENCOUNTER — Ambulatory Visit: Payer: Medicaid Other

## 2020-08-07 ENCOUNTER — Other Ambulatory Visit: Payer: Self-pay | Admitting: Obstetrics and Gynecology

## 2020-08-07 ENCOUNTER — Ambulatory Visit
Admission: RE | Admit: 2020-08-07 | Discharge: 2020-08-07 | Disposition: A | Discharge status: Home / Self Care | Payer: No Typology Code available for payment source | Source: Ambulatory Visit | Attending: Obstetrics and Gynecology | Admitting: Obstetrics and Gynecology

## 2020-08-07 ENCOUNTER — Other Ambulatory Visit: Payer: Self-pay

## 2020-08-07 ENCOUNTER — Ambulatory Visit: Payer: Self-pay | Admitting: *Deleted

## 2020-08-07 ENCOUNTER — Ambulatory Visit
Admission: RE | Admit: 2020-08-07 | Discharge: 2020-08-07 | Disposition: A | Discharge status: Home / Self Care | Payer: Medicaid Other | Source: Ambulatory Visit | Attending: Obstetrics and Gynecology | Admitting: Obstetrics and Gynecology

## 2020-08-07 VITALS — BP 124/82 | Wt 141.9 lb

## 2020-08-07 DIAGNOSIS — N6310 Unspecified lump in the right breast, unspecified quadrant: Secondary | ICD-10-CM

## 2020-08-07 DIAGNOSIS — Z1239 Encounter for other screening for malignant neoplasm of breast: Secondary | ICD-10-CM

## 2020-08-07 NOTE — Patient Instructions (Signed)
Explained breast self awareness with Marry Guan. Patient did not need a Pap smear today due to last Pap smear was 08/17/2017. Let her know B[facility]CP will cover Pap smears every 3 years unless has a history of abnormal Pap smears. Referred patient to the [facility] for a diagnostic mammogram. Appointment scheduled Tuesday, August 07, 2020 at 1320. Patient aware of appointment and will be there. Marry Guan verbalized understanding.  Theoren Palka, Kathaleen Maser, RN 10:40 AM

## 2020-08-07 NOTE — Progress Notes (Signed)
[name] is a 30 y.o. female who presents to [facility] clinic today with complaint of right breast lump x 2 months that is painful at times. Patient rates the pain at a 1 out of 10.    Pap Smear: Pap smear not completed today. Last Pap smear was 08/17/2017 at [facility] and was normal. Per patient has no history of an abnormal Pap smear. Last Pap smear result is available in Epic.   Physical exam: Breasts Right breat is slightly larger than left breast that per patient has not noticed any changes. No skin abnormalities bilateral breasts. No nipple retraction bilateral breasts. No nipple discharge bilateral breasts. No lymphadenopathy. No lumps palpated left breast. Palpated a firm area within the right breat at 1:30 o'clock 4 cm from the nipple. No complaints of pain or tenderness on exam.    Pelvic/Bimanual Pap is not indicated today per BCCCP guidelines. Patient has [facility] and will call her provider to schedule Pap smear.   Smoking History: Patient has never smoked.   Patient Navigation: Patient education provided. Access to services provided for patient through BCCCP program.    Breast and Cervical Cancer Risk Assessment: Patient has family history of a maternal aunt having breast cancer. Patient has no known genetic mutations or radiation treatment to the chest before age 37. Patient does not have history of cervical dysplasia, immunocompromised, or DES exposure in-utero. Breast cancer risk assessment completed. No breast cancer risk calculated due to patient is less than 54 years old.   Risk Assessment    Risk Scores      08/07/2020   Last edited by: Melanie Rutherford, LPN   5-year risk:    Lifetime risk:           A: BCCCP exam without pap smear Complaint of right breast lump and pain.  P: Referred patient to the [facility] for a diagnostic mammogram. Appointment scheduled Tuesday, August 07, 2020  at 1320.  Melanie Heidelberg, RN 08/07/2020 10:40 AM

## 2020-08-17 ENCOUNTER — Ambulatory Visit: Payer: Medicaid Other | Admitting: Obstetrics and Gynecology

## 2020-08-24 ENCOUNTER — Ambulatory Visit (INDEPENDENT_AMBULATORY_CARE_PROVIDER_SITE_OTHER): Payer: Medicaid Other | Admitting: Obstetrics & Gynecology

## 2020-08-24 ENCOUNTER — Encounter: Payer: Self-pay | Admitting: Obstetrics & Gynecology

## 2020-08-24 ENCOUNTER — Other Ambulatory Visit: Payer: Self-pay

## 2020-08-24 ENCOUNTER — Other Ambulatory Visit (HOSPITAL_COMMUNITY)
Admission: RE | Admit: 2020-08-24 | Discharge: 2020-08-24 | Disposition: A | Discharge status: Home / Self Care | Payer: Medicaid Other | Source: Ambulatory Visit | Attending: Obstetrics and Gynecology | Admitting: Obstetrics and Gynecology

## 2020-08-24 VITALS — BP 127/79 | HR 82 | Ht 61.0 in | Wt 140.0 lb

## 2020-08-24 DIAGNOSIS — Z01419 Encounter for gynecological examination (general) (routine) without abnormal findings: Secondary | ICD-10-CM | POA: Diagnosis not present

## 2020-08-24 DIAGNOSIS — Z124 Encounter for screening for malignant neoplasm of cervix: Secondary | ICD-10-CM

## 2020-08-24 DIAGNOSIS — Z800 Family history of malignant neoplasm of digestive organs: Secondary | ICD-10-CM | POA: Diagnosis not present

## 2020-08-24 DIAGNOSIS — Z113 Encounter for screening for infections with a predominantly sexual mode of transmission: Secondary | ICD-10-CM | POA: Diagnosis not present

## 2020-08-24 NOTE — Progress Notes (Addendum)
GYN presents for AEX/PAP, c/o discharge, something going on.  PHQ-9=0  Last PAP 08/17/2017

## 2020-08-24 NOTE — Progress Notes (Addendum)
30 y.o. [id number] Single Black or African American female here for annual exam.  Has a Mirena IUD that was placed 10/15/2018.  New seven year indication discussed.  She has amenorrhea and is very happy with this.  No LMP recorded (lmp unknown). (Menstrual status: IUD).          Sexually active: No.  The current method of family planning is IUD.    Exercising: Yes.     Smoker:  no  Health Maintenance: Pap:  2019 negative History of abnormal Pap:  no MMG:  08/2020   reports that she has never smoked. She has never used smokeless tobacco. She reports current alcohol use. She reports that she does not use drugs.  Past Medical History:  Diagnosis Date   Anemia    Complication of anesthesia    problems getting numb   Scoliosis     Past Surgical History:  Procedure Laterality Date   CESAREAN SECTION     x2   CESAREAN SECTION N/A 02/19/2018   Procedure: REPEAT CESAREAN SECTION;  Surgeon: Hermina Staggers, MD;  [facility]: [facility];  Service: Obstetrics;  Laterality: N/A;    Current Outpatient Medications  Medication Sig Dispense Refill   loratadine (CLARITIN) 10 MG tablet Take 10 mg by mouth daily.     No current facility-administered medications for this visit.    Family History  Problem Relation Age of Onset   Diabetes Mother    Hypertension Mother    Colon cancer Mother 33   Diabetes Father    Hypertension Father    Kidney disease Maternal Aunt    Hypertension Maternal Aunt    Diabetes Maternal Aunt    Breast cancer Maternal Aunt 60   Stomach cancer Maternal Aunt    Diabetes Maternal Uncle    Hypertension Maternal Uncle    Cancer Maternal Grandmother    Hypertension Maternal Grandfather     Review of Systems  All other systems reviewed and are negative.   Exam:   BP 127/79    Pulse 82    Ht 5\' 1"  (1.549 m)    Wt 140 lb (63.5 kg)    LMP  (LMP Unknown)    BMI 26.45 kg/m   Height: 5\' 1"  (154.9 cm)  General appearance: alert, cooperative  and appears stated age Head: Normocephalic, without obvious abnormality, atraumatic Breasts: normal appearance, no masses or tenderness Heart: regular rate and rhythm Abdomen: soft, non-tender; bowel sounds normal; no masses,  no organomegaly Extremities: extremities normal, atraumatic, no cyanosis or edema Skin: Skin color, texture, turgor normal. No rashes or lesions Lymph nodes: Cervical, supraclavicular, and axillary nodes normal. No abnormal inguinal nodes palpated Neurologic: Grossly normal   Pelvic: External genitalia:  no lesions              Urethra:  normal appearing urethra with no masses, tenderness or lesions              Bartholins and Skenes: normal                 Vagina: normal appearing vagina with normal color and discharge, no lesions              Cervix: no lesions and IUD string noted              Pap taken: Yes.   Bimanual Exam:  Uterus:  normal size, contour, position, consistency, mobility, non-tender  Adnexa: normal adnexa and no mass, fullness, tenderness  Chaperone was present for exam.  Assessment/Plan: 1. Well woman exam with routine gynecological exam - Cytology - PAP( [place]) - screening mammogram guidelines reviewed  2. Screen for STD (sexually transmitted disease) - Cervicovaginal ancillary only( [place]) - RPR - HIV Antibody (routine testing w rflx)  3. Family history of colon cancer - we discussed possible genetic testing as she gets closer to age 34. - colonoscopy recommended at age 30 due to mother's diagnosis of stage III colon cancer at age 1.  Pt voices clear understanding.  4.  IUD in placed, Mirena - 7 year indication discussed

## 2020-08-25 LAB — HIV ANTIBODY (ROUTINE TESTING W REFLEX): HIV Screen 4th Generation wRfx: NONREACTIVE

## 2020-08-25 LAB — RPR: RPR Ser Ql: NONREACTIVE

## 2020-08-27 LAB — CERVICOVAGINAL ANCILLARY ONLY
Chlamydia: NEGATIVE
Comment: NEGATIVE
Comment: NEGATIVE
Comment: NORMAL
Neisseria Gonorrhea: NEGATIVE
Trichomonas: NEGATIVE

## 2020-08-27 LAB — CYTOLOGY - PAP
Adequacy: ABSENT
Comment: NEGATIVE
Diagnosis: NEGATIVE
High risk HPV: NEGATIVE

## 2022-10-22 IMAGING — US US BREAST*R* LIMITED INC AXILLA
1 series · 4 of 4 positions shown · non-contrast
Comparison: None.

CLINICAL DATA: 29-year-old female presenting for evaluation of a
palpable lump in the right breast for a few months. This is the
patient's baseline mammogram. She has family history of breast
cancer in a recently diagnosed aunt.

EXAM:
DIGITAL DIAGNOSTIC BILATERAL MAMMOGRAM WITH TOMO AND CAD; ULTRASOUND
RIGHT BREAST LIMITED

[Series 1: us breast*right* limited inc axilla · 0.06mm/px · 4 of 4 slices shown]
[im 1/4]
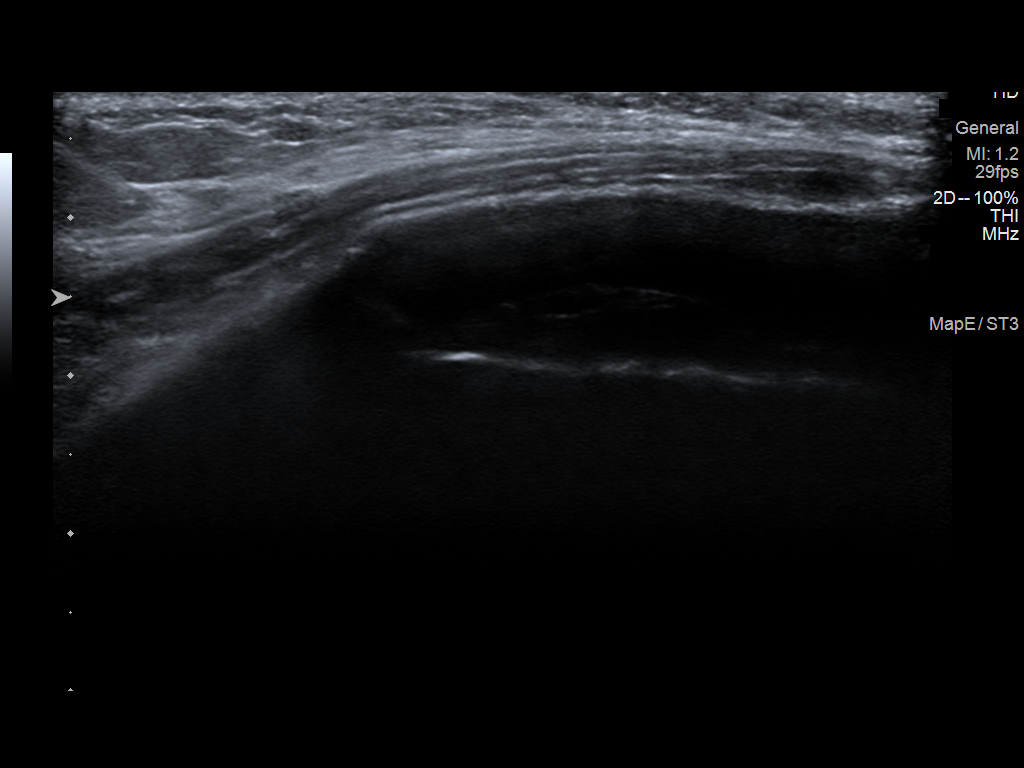
[im 2/4]
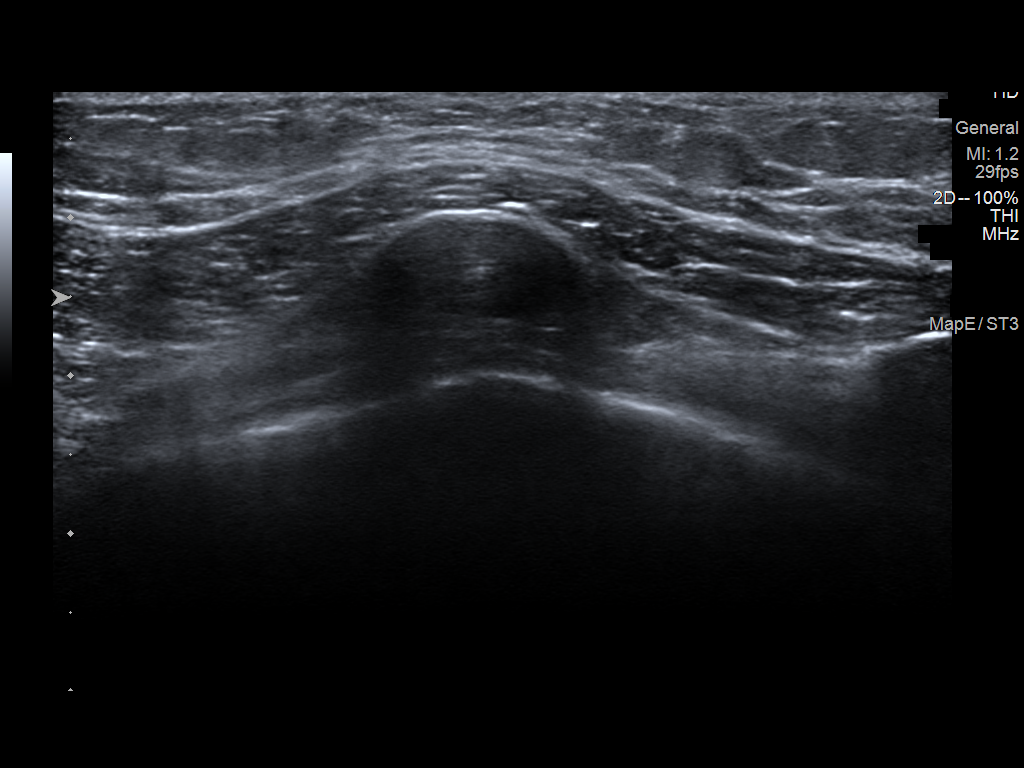
[im 3/4]
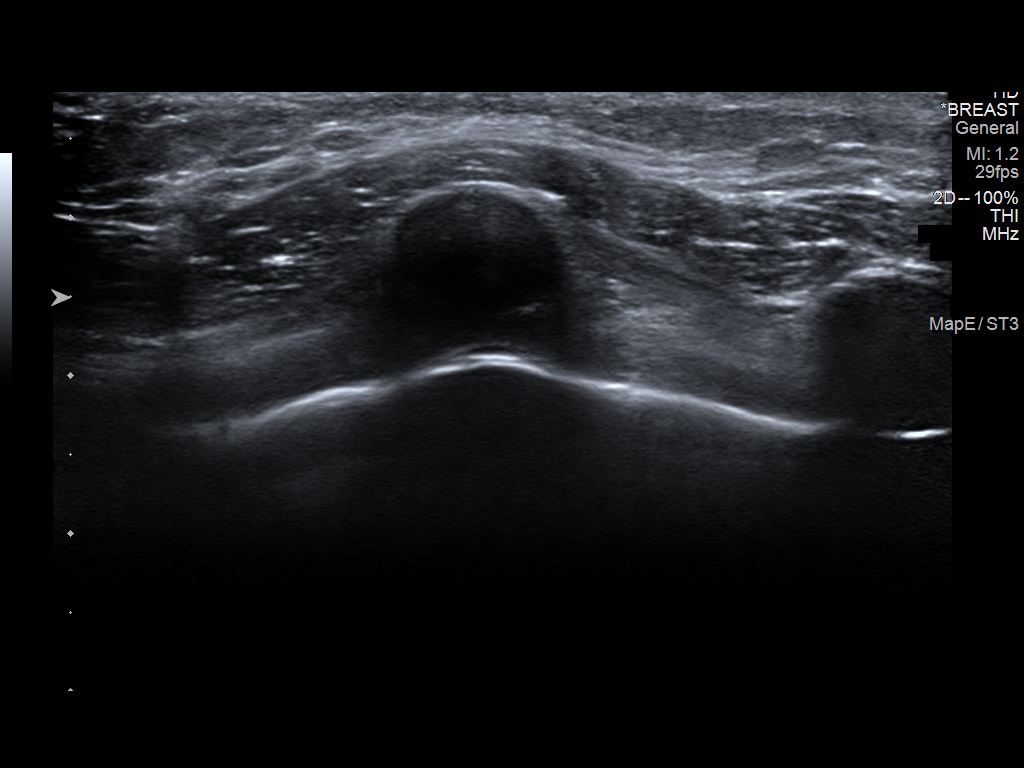
[im 4/4]
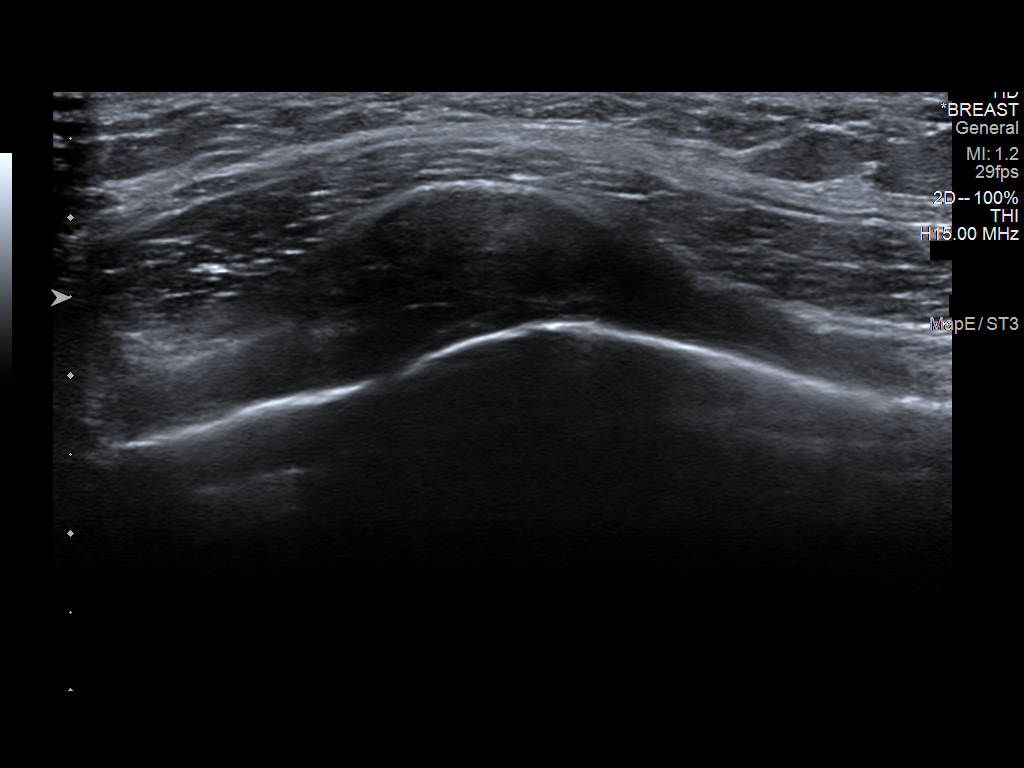

[4 of 4 positions shown; findings below may reference images not displayed]

ACR Breast Density Category b: There are scattered areas of
fibroglandular density.
FINDINGS: A BB indicating the palpable site of concern has been placed on the
upper inner quadrant of the right breast. No suspicious mammographic
findings are identified deep to the palpable marker. In the
retroareolar left breast, there is a 6 mm group of lucent centered
benign calcifications. No other suspicious calcifications, masses or
areas of distortion are seen in the bilateral breasts.

Mammographic images were processed with CAD.

Physical exam of the palpable site in the upper inner right breast
demonstrates an elongated firm prominence, most likely representing
an asymmetrically prominent rib. The patient states she does have
scoliosis.

Ultrasound targeted to the palpable site in the right breast at 2
o'clock, 8 cm from the nipple demonstrates normal fibroglandular
tissue. No suspicious masses or areas of shadowing are identified.
There is a prominent rib at the palpable site, likely corresponding
with the area of concern identified by the patient.
IMPRESSION: 1. No suspicious mammographic or targeted sonographic abnormalities
are identified at the palpable site in the right breast. The
palpable area likely corresponds with an asymmetrically prominent
rib.

2.  Benign calcifications in the left breast.

3.  No evidence of malignancy in the bilateral breasts.

RECOMMENDATION:
Screening mammogram at age 39 unless there are persistent or
intervening clinical concerns. (Code:67-P-33W)

I have discussed the findings and recommendations with the patient.
If applicable, a reminder letter will be sent to the patient
regarding the next appointment.

BI-RADS CATEGORY  2: Benign.

## 2023-05-22 ENCOUNTER — Ambulatory Visit: Payer: Medicaid Other | Admitting: Obstetrics and Gynecology

## 2024-05-27 ENCOUNTER — Other Ambulatory Visit: Payer: Self-pay

## 2024-05-27 ENCOUNTER — Encounter: Payer: Self-pay | Admitting: Allergy

## 2024-05-27 ENCOUNTER — Ambulatory Visit (INDEPENDENT_AMBULATORY_CARE_PROVIDER_SITE_OTHER): Admitting: Allergy

## 2024-05-27 VITALS — BP 128/84 | HR 80 | Temp 98.3°F | Resp 16 | Ht 61.25 in | Wt 143.5 lb

## 2024-05-27 DIAGNOSIS — L299 Pruritus, unspecified: Secondary | ICD-10-CM

## 2024-05-27 DIAGNOSIS — B360 Pityriasis versicolor: Secondary | ICD-10-CM

## 2024-05-27 DIAGNOSIS — J310 Chronic rhinitis: Secondary | ICD-10-CM | POA: Diagnosis not present

## 2024-05-27 DIAGNOSIS — K9049 Malabsorption due to intolerance, not elsewhere classified: Secondary | ICD-10-CM

## 2024-05-27 DIAGNOSIS — J358 Other chronic diseases of tonsils and adenoids: Secondary | ICD-10-CM

## 2024-05-27 DIAGNOSIS — H109 Unspecified conjunctivitis: Secondary | ICD-10-CM

## 2024-05-27 DIAGNOSIS — L509 Urticaria, unspecified: Secondary | ICD-10-CM | POA: Diagnosis not present

## 2024-05-27 DIAGNOSIS — T7819XD Other adverse food reactions, not elsewhere classified, subsequent encounter: Secondary | ICD-10-CM

## 2024-05-27 MED ORDER — LEVOCETIRIZINE DIHYDROCHLORIDE 5 MG PO TABS: 5.0000 mg | ORAL_TABLET | Freq: Two times a day (BID) | ORAL | 3 refills | Status: DC

## 2024-05-27 MED ORDER — KETOCONAZOLE 2 % EX SHAM: MEDICATED_SHAMPOO | CUTANEOUS | 2 refills | Status: AC

## 2024-05-27 MED ORDER — FAMOTIDINE 20 MG PO TABS: 20.0000 mg | ORAL_TABLET | Freq: Two times a day (BID) | ORAL | 3 refills | Status: DC

## 2024-05-27 NOTE — Patient Instructions (Addendum)
 Chronic urticaria and pruritus Chronic urticaria with hives and itching triggered by environmental factors and possibly food allergens. Differential includes spontaneous urticaria and potential autoimmune or allergic conditions. - Order blood work for underlying causes: blood cell counts, liver and kidney function, inflammatory markers, thyroid studies, allergy testing for environmental and food allergens. - Prescribe high-dose Xyzal 5mg  1 tab twice a day and Pepcid  20mg  1 tab twice a day. - Discuss chronic hive medications (ie Xolair, Dupixent or Rhapsido) if antihistamines insufficient.  - Hives can be caused by a variety of different triggers including illness/infection, foods, medications, stings, exercise, pressure, vibrations, extremes of temperature to name a few however majority of the time there is no identifiable trigger.    Pollen-food allergy syndrome (oral allergy syndrome) Oral allergy syndrome with oral itching from raw fruits like kiwis and apples, consistent with pollen recognition. - Include specific food allergens in allergy testing, focusing on kiwis and apples.  Suspected gluten intolerance Suspected gluten intolerance with bloating post-gluten consumption. Differential includes intolerance versus allergy. - Order gluten allergy testing to differentiate between intolerance and allergy.  Tinea versicolor Tinea versicolor with light skin patches, more noticeable in warmer months. Previous antifungal treatments provided relief. - Prescribe ketoconazole shampoo 2% as body wash, ensure 5-minute skin contact before rinsing.  Tonsil stones (tonsillolithiasis) - tonsil stones may develop from deep tonsillar crypts that accumulate debris, such as food or sloughed mucosa, thereby providing an ideal environment for the growth of bacteria. May see whitish or yellow pieces of semisolid debris on or emanating from their tonsils. These tonsilliths often have a foul taste and odor, and they  can cause halitosis.  Treatment includes frequent gargling of a hydrogen peroxide mouthwash.    Follow-up in 3-4 months or sooner if needed

## 2024-05-27 NOTE — Progress Notes (Signed)
 New Patient Note  RE: Melanie Calderon MRN: [id number] DOB: 08/20/1990 Date of Office Visit: 05/27/2024  Primary care provider: Reggie Nian, [place], *  Chief Complaint: Mouth reactions with certain foods, hives  History of present illness: Melanie Calderon is a 33 y.o. female presenting today for evaluation of food allergy. She is a former patient of the practice last seeing Dr. Kozlow on 06/19/2015 for perioral dermatitis. Discussed the use of AI scribe software for clinical note transcription with the patient, who gave verbal consent to proceed.  She experiences hives, itching, sneezing, itchy eyes, and congestion, triggered by foods such as kiwi, apples, and spaghetti sauce, as well as environmental factors like trees, cats, and dogs.  Exposure to the sun and swimming can also trigger hives and itchiness, leading to light blotches on her skin. She has tried various over-the-counter treatments, including antifungal shampoos like Head and Shoulders, which have provided some relief. However, she continues to experience itchiness even outside the hot months.  She has been using Benadryl  for her symptoms.  She is concerned about potential food allergies, particularly with kiwi and apples, which cause oral itchiness, and has been avoiding these foods. She also experiences bloating with gluten-containing foods and is interested in testing for gluten allergy.  Occasional tonsillar swelling is associated with halitosis, and she suspects tonsil stones. She experiences ear clogging, especially after swimming, and has tried peroxide and Swim-Ear drops without relief.      Review of systems: 10pt ROS negative unless noted above in HPI  Past medical history: Past Medical History:  Diagnosis Date   Anemia    Complication of anesthesia    problems getting numb   Scoliosis    Urticaria     Past surgical history: Past Surgical History:  Procedure Laterality Date   CESAREAN SECTION      x2   CESAREAN SECTION N/A 02/19/2018   Procedure: REPEAT CESAREAN SECTION;  Surgeon: Lorence Ozell CROME, MD;  Location: [facility];  Service: Obstetrics;  Laterality: N/A;    Family history:  Family History  Problem Relation Age of Onset   Diabetes Mother    Hypertension Mother    Colon cancer Mother 32   Allergic rhinitis Father    Diabetes Father    Hypertension Father    Allergic rhinitis Sister    Allergic rhinitis Maternal Aunt    Kidney disease Maternal Aunt    Hypertension Maternal Aunt    Diabetes Maternal Aunt    Breast cancer Maternal Aunt 60   Stomach cancer Maternal Aunt    Allergic rhinitis Maternal Uncle    Diabetes Maternal Uncle    Hypertension Maternal Uncle    Cancer Maternal Grandmother    Hypertension Maternal Grandfather    Eczema Daughter    Asthma Daughter    Eczema Daughter    Asthma Daughter     Social history: Lives in a home with carpeting with electric heating and central cooling.  No pets at home.  There is no concern for water damage, mildew or roaches in the home.  She is a Runner, broadcasting/film/video.  Denies a smoking history.   Medication List: Current Outpatient Medications  Medication Sig Dispense Refill   cholecalciferol (VITAMIN D3) 25 MCG (1000 UNIT) tablet Take 1,000 Units by mouth daily.     hydrOXYzine (ATARAX) 10 MG tablet Take 10 mg by mouth 3 (three) times daily as needed.     loratadine  (CLARITIN ) 10 MG tablet Take 10 mg by mouth  daily.     omeprazole (PRILOSEC OTC) 20 MG tablet Take 20 mg by mouth daily.     No current facility-administered medications for this visit.    Known medication allergies: No Known Allergies   Physical examination: Blood pressure 128/84, pulse 80, temperature 98.3 F (36.8 C), resp. rate 16, height 5' 1.25 (1.556 m), weight 143 lb 8 oz (65.1 kg), SpO2 97%, currently breastfeeding.  General: Alert, interactive, in no acute distress. HEENT: PERRLA, TMs pearly gray, turbinates non-edematous without  discharge, post-pharynx non erythematous. Neck: Supple without lymphadenopathy. Lungs: Clear to auscultation without wheezing, rhonchi or rales. {no increased work of breathing. CV: Normal S1, S2 without murmurs. Abdomen: Nondistended, nontender. Skin: Numerous hypopigmented round patches about the size of eraser to dime on the back. Extremities:  No clubbing, cyanosis or edema. Neuro:   Grossly intact.  Diagnostics/Labs: None today  Assessment and plan: Chronic urticaria and pruritus Chronic urticaria with hives and itching triggered by environmental factors and possibly food allergens. Differential includes spontaneous urticaria and potential autoimmune or allergic conditions. - Order blood work for underlying causes: blood cell counts, liver and kidney function, inflammatory markers, thyroid studies, allergy testing for environmental and food allergens. - Prescribe high-dose Xyzal 5mg  1 tab twice a day and Pepcid  20mg  1 tab twice a day. - Discuss chronic hive medications (ie Xolair, Dupixent or Rhapsido) if antihistamines insufficient.  - Hives can be caused by a variety of different triggers including illness/infection, foods, medications, stings, exercise, pressure, vibrations, extremes of temperature to name a few however majority of the time there is no identifiable trigger.    Pollen-food allergy syndrome (oral allergy syndrome) Oral allergy syndrome with oral itching from raw fruits like kiwis and apples, consistent with pollen recognition. - Include specific food allergens in allergy testing, focusing on kiwis and apples.  Suspected gluten intolerance Suspected gluten intolerance with bloating post-gluten consumption. Differential includes intolerance versus allergy. - Order gluten allergy testing to differentiate between intolerance and allergy.  Tinea versicolor Tinea versicolor with light skin patches, more noticeable in warmer months. Previous antifungal treatments provided  relief. - Prescribe ketoconazole shampoo 2% as body wash, ensure 5-minute skin contact before rinsing.  Tonsil stones - tonsil stones may develop from deep tonsillar crypts that accumulate debris, such as food or sloughed mucosa, thereby providing an ideal environment for the growth of bacteria. May see whitish or yellow pieces of semisolid debris on or emanating from their tonsils. These tonsilliths often have a foul taste and odor, and they can cause halitosis.  Treatment includes frequent gargling of a hydrogen peroxide mouthwash.   - Will place ENT referral  Follow-up in 3-4 months or sooner if needed  I appreciate the opportunity to take part in Elayne's care. Please do not hesitate to contact me with questions.  Sincerely,   Danita Brain, MD Allergy/Immunology [facility]

## 2024-06-03 ENCOUNTER — Ambulatory Visit: Payer: Self-pay | Admitting: Allergy

## 2024-06-03 LAB — ALLERGENS W/TOTAL IGE AREA 2
Alternaria Alternata IgE: <0.1 kU/L
Aspergillus Fumigatus IgE: <0.1 kU/L
Bermuda Grass IgE: 20.2 kU/L — AB
Cat Dander IgE: 14.5 kU/L — AB
Cedar, Mountain IgE: 3.11 kU/L — AB
Cladosporium Herbarum IgE: <0.1 kU/L
Cockroach, German IgE: <0.1 kU/L
Common Silver Birch IgE: 0.18 kU/L — AB
Cottonwood IgE: <0.1 kU/L
D Farinae IgE: 0.14 kU/L — AB
D Pteronyssinus IgE: 0.21 kU/L — AB
Dog Dander IgE: 6.12 kU/L — AB
Elm, American IgE: 0.34 kU/L — AB
Johnson Grass IgE: 20.9 kU/L — AB
Maple/Box Elder IgE: 1.81 kU/L — AB
Mouse Urine IgE: <0.1 kU/L
Oak, White IgE: <0.1 kU/L
Pecan, Hickory IgE: 0.5 kU/L — AB
Penicillium Chrysogen IgE: <0.1 kU/L
Pigweed, Rough IgE: <0.1 kU/L
Ragweed, Short IgE: 0.59 kU/L — AB
Sheep Sorrel IgE Qn: 0.11 kU/L — AB
Timothy Grass IgE: 33.3 kU/L — AB
White Mulberry IgE: <0.1 kU/L

## 2024-06-03 LAB — ALLERGEN, WHEAT, F4: Wheat IgE: 0.5 kU/L — AB

## 2024-06-03 LAB — CHRONIC URTICARIA (CU) EVAL
ALT: 11 IU/L (ref 0–32)
Basophils Absolute: 0 x10E3/uL (ref 0.0–0.2)
Basos: 1 %
CRP: <1 mg/L (ref 0–10)
EOS (ABSOLUTE): 0.3 x10E3/uL (ref 0.0–0.4)
Eos: 6 %
Hematocrit: 40.7 % (ref 34.0–46.6)
Hemoglobin: 13.4 g/dL (ref 11.1–15.9)
Immature Grans (Abs): 0 x10E3/uL (ref 0.0–0.1)
Immature Granulocytes: 0 %
Lymphocytes Absolute: 2 x10E3/uL (ref 0.7–3.1)
Lymphs: 39 %
MCH: 31.5 pg (ref 26.6–33.0)
MCHC: 32.9 g/dL (ref 31.5–35.7)
MCV: 96 fL (ref 79–97)
Monocytes Absolute: 0.3 x10E3/uL (ref 0.1–0.9)
Monocytes: 6 %
Neutrophils Absolute: 2.5 x10E3/uL (ref 1.4–7.0)
Neutrophils: 48 %
Platelets: 257 x10E3/uL (ref 150–450)
Pooled Donor- BAT CU: 5 % (ref 0.00–10.60)
RBC: 4.26 x10E6/uL (ref 3.77–5.28)
RDW: 11.6 % — ABNORMAL LOW (ref 11.7–15.4)
Sed Rate: 7 mm/h (ref 0–32)
TSH: 1.94 u[IU]/mL (ref 0.450–4.500)
Thyroperoxidase Ab SerPl-aCnc: >600 [IU]/mL — ABNORMAL HIGH (ref 0–34)
WBC: 5.2 x10E3/uL (ref 3.4–10.8)

## 2024-06-03 LAB — IGE NUT PROF. W/COMPONENT RFLX
F017-IgE Hazelnut (Filbert): <0.1 kU/L
F018-IgE Brazil Nut: <0.1 kU/L
F020-IgE Almond: <0.1 kU/L
F202-IgE Cashew Nut: <0.1 kU/L
F203-IgE Pistachio Nut: 0.1 kU/L — AB
F256-IgE Walnut: <0.1 kU/L
Macadamia Nut, IgE: <0.1 kU/L
Peanut, IgE: <0.1 kU/L
Pecan Nut IgE: <0.1 kU/L

## 2024-06-03 LAB — ALLERGEN BARLEY F6: Allergen Barley IgE: 0.53 kU/L — AB

## 2024-06-03 LAB — ALPHA-GAL PANEL
Allergen Lamb IgE: <0.1 kU/L
Beef IgE: <0.1 kU/L
IgE (Immunoglobulin E), Serum: 157 [IU]/mL (ref 6–495)
O215-IgE Alpha-Gal: <0.1 kU/L
Pork IgE: <0.1 kU/L

## 2024-06-03 LAB — ALLERGEN, KIWI FRUIT, F84: Kiwi Fruit: <0.1 kU/L

## 2024-06-03 LAB — TRYPTASE: Tryptase: 6.8 ug/L (ref 2.2–13.2)

## 2024-06-03 LAB — ALLERGEN, APPLE F49: Allergen Apple, IgE: <0.1 kU/L

## 2024-06-03 LAB — ALLERGEN, TOMATO F25: Allergen Tomato, IgE: <0.1 kU/L

## 2024-06-06 MED ORDER — EPINEPHRINE 0.3 MG/0.3ML IJ SOAJ: 0.3000 mg | INTRAMUSCULAR | 2 refills | Status: AC | PRN

## 2024-06-07 ENCOUNTER — Encounter (INDEPENDENT_AMBULATORY_CARE_PROVIDER_SITE_OTHER): Payer: Self-pay

## 2024-07-15 ENCOUNTER — Telehealth: Payer: Self-pay | Admitting: Allergy

## 2024-07-15 NOTE — Telephone Encounter (Signed)
 Referral is closed due to unable to contact

## 2024-08-26 ENCOUNTER — Other Ambulatory Visit: Payer: Self-pay

## 2024-08-26 ENCOUNTER — Ambulatory Visit: Admitting: Internal Medicine

## 2024-08-26 ENCOUNTER — Ambulatory Visit: Payer: Self-pay | Admitting: Physician Assistant

## 2024-08-26 ENCOUNTER — Encounter: Payer: Self-pay | Admitting: Physician Assistant

## 2024-08-26 ENCOUNTER — Other Ambulatory Visit (HOSPITAL_COMMUNITY)
Admission: RE | Admit: 2024-08-26 | Discharge: 2024-08-26 | Disposition: A | Source: Ambulatory Visit | Attending: Physician Assistant | Admitting: Physician Assistant

## 2024-08-26 ENCOUNTER — Encounter: Payer: Self-pay | Admitting: Internal Medicine

## 2024-08-26 VITALS — BP 112/68 | HR 71 | Temp 98.7°F | Resp 16 | Ht 61.0 in | Wt 152.1 lb

## 2024-08-26 VITALS — BP 128/92 | HR 78 | Ht 61.5 in | Wt 147.6 lb

## 2024-08-26 DIAGNOSIS — K9041 Non-celiac gluten sensitivity: Secondary | ICD-10-CM

## 2024-08-26 DIAGNOSIS — L508 Other urticaria: Secondary | ICD-10-CM

## 2024-08-26 DIAGNOSIS — R350 Frequency of micturition: Secondary | ICD-10-CM

## 2024-08-26 DIAGNOSIS — Z30432 Encounter for removal of intrauterine contraceptive device: Secondary | ICD-10-CM

## 2024-08-26 DIAGNOSIS — T8332XA Displacement of intrauterine contraceptive device, initial encounter: Secondary | ICD-10-CM | POA: Diagnosis not present

## 2024-08-26 DIAGNOSIS — J358 Other chronic diseases of tonsils and adenoids: Secondary | ICD-10-CM | POA: Diagnosis not present

## 2024-08-26 DIAGNOSIS — T7819XD Other adverse food reactions, not elsewhere classified, subsequent encounter: Secondary | ICD-10-CM | POA: Diagnosis not present

## 2024-08-26 DIAGNOSIS — R030 Elevated blood-pressure reading, without diagnosis of hypertension: Secondary | ICD-10-CM | POA: Diagnosis not present

## 2024-08-26 LAB — CERVICOVAGINAL ANCILLARY ONLY
Bacterial Vaginitis (gardnerella): NEGATIVE
Candida Glabrata: NEGATIVE
Candida Vaginitis: POSITIVE — AB
Chlamydia: NEGATIVE
Comment: NEGATIVE
Comment: NEGATIVE
Comment: NEGATIVE
Comment: NEGATIVE
Comment: NEGATIVE
Comment: NORMAL
Neisseria Gonorrhea: NEGATIVE
Trichomonas: NEGATIVE

## 2024-08-26 MED ORDER — FAMOTIDINE 20 MG PO TABS: 20.0000 mg | ORAL_TABLET | Freq: Two times a day (BID) | ORAL | 3 refills | Status: AC | PRN

## 2024-08-26 MED ORDER — LEVOCETIRIZINE DIHYDROCHLORIDE 5 MG PO TABS: 5.0000 mg | ORAL_TABLET | Freq: Two times a day (BID) | ORAL | 3 refills | Status: AC | PRN

## 2024-08-26 NOTE — Progress Notes (Signed)
" ° °  FOLLOW UP Date of Service/Encounter:  08/26/24   Subjective:  Melanie Calderon (DOB: 11-12-1990) is a 34 y.o. female who [facility] for follow up for lab results review.  History obtained from: chart review and patient. Last visit was on 05/27/2024 with Dr Jeneal Urticaria- use xyzal /Pepcid  PFAS- kiwi/apples Suspected gluten intolerance Tonsil stones- referred to ENT   Notes having hives 1-2x/week, usually last a few hours.  Does note a lot of trouble with itching also.  Not taking any allergy medications at this time.    Notes trouble with possible bloating with gluten but not really sure.  Has not tried cutting it out of diet an can't correlate it with wheat each time.   Requesting referral for ENT for tonsil stones. Past Medical History: Past Medical History:  Diagnosis Date   Anemia    Complication of anesthesia    problems getting numb   Scoliosis    Urticaria     Objective:  BP 112/68 (BP Location: Right Arm, Patient Position: Sitting, Cuff Size: Normal)   Pulse 71   Temp 98.7 F (37.1 C) (Temporal)   Resp 16   Ht 5' 1 (1.549 m)   Wt 152 lb 1.6 oz (69 kg)   BMI 28.74 kg/m  Body mass index is 28.74 kg/m. Physical Exam: GEN: alert, well developed HEENT: clear conjunctiva, nose without rhinorrhea  HEART: regular rate and rhythm, no murmur LUNGS: clear to auscultation bilaterally, no coughing, unlabored respiration SKIN: no rashes or lesions   Assessment:   1. Chronic urticaria   2. Pollen-food allergy, subsequent encounter   3. Gluten intolerance   4. Tonsil stone     Plan/Recommendations:  Urticaria (Hives): - At this time etiology of hives and swelling is unknown. Hives can be caused by a variety of different triggers including illness/infection, pressure, vibrations, extremes of temperature to name a few however majority of the time there is no identifiable trigger.  -Positive CU index  -Start Xyzal   5mg  daily.  -If hives remain uncontrolled, increase to Xyzal  5mg  twice daily.   -If no improvement in 2-3 days, add Pepcid  20mg  twice daily and continue Xyzal  5mg  twice daily.  Oral Allergy Syndrome-Kiwi, Apple - These symptoms are typically not life-threatening and are because of a cross reaction between a pollen you are allergic to, and to a protein in specific foods (such as fresh fruits, vegetables, and nuts). - If you can eat these things and tolerate the symptoms, it is fine to continue to do so.  If not, you may avoid these fresh fruits and vegetables.   - Heating these foods, buying them canned, and peeling these foods should allow them to be consumed without symptoms or with less symptoms. - Patients typically report itching and/or mild swelling of the mouth and throat immediately following ingestion of certain uncooked fruits (including nuts) or raw vegetables.  - Only a very small number of affected individuals experience systemic allergic reactions, such as anaphylaxis which occurs with true food allergies.    Gluten Intolerance?- Bloating - Recommend cutting out gluten from diet for 2 weeks and see if symptoms improve; if not, okay to reintroduce.    Tonsil Stones -  Referral placed at last visit. [facility] call [phone] to make an appointment with ENT. They have tried contacting twice but were unable to reach.    Arleta Blanch, MD [facility]       "

## 2024-08-26 NOTE — Patient Instructions (Addendum)
 Urticaria (Hives): - At this time etiology of hives and swelling is unknown. Hives can be caused by a variety of different triggers including illness/infection, pressure, vibrations, extremes of temperature to name a few however majority of the time there is no identifiable trigger.  -Start Xyzal  5mg  daily.  -If hives remain uncontrolled, increase to Xyzal  5mg  twice daily.   -If no improvement in 2-3 days, add Pepcid  20mg  twice daily and continue Xyzal  5mg  twice daily.  Oral Allergy Syndrome-Kiwi, Apple - These symptoms are typically not life-threatening and are because of a cross reaction between a pollen you are allergic to, and to a protein in specific foods (such as fresh fruits, vegetables, and nuts). - If you can eat these things and tolerate the symptoms, it is fine to continue to do so.  If not, you may avoid these fresh fruits and vegetables.   - Heating these foods, buying them canned, and peeling these foods should allow them to be consumed without symptoms or with less symptoms. - Patients typically report itching and/or mild swelling of the mouth and throat immediately following ingestion of certain uncooked fruits (including nuts) or raw vegetables.  - Only a very small number of affected individuals experience systemic allergic reactions, such as anaphylaxis which occurs with true food allergies.    Gluten Intolerance- Bloating - Recommend cutting out gluten from diet for 2 weeks and see if symptoms improve; if not, okay to reintroduce.    Tonsil Stones -  [facility] call [phone] to make an appointment with ENT.

## 2024-08-26 NOTE — Progress Notes (Unsigned)
 GYNECOLOGY  VISIT   HPI: Melanie Calderon is a 34 y.o.  single female [phone] here for Mirena  IUD removal.  IUD was placed in 2000, patient reports more headaches that become migraines (without aura) while using IUD.  Also having intermittent spotting that she does not like.  She has tried using Depo previously and used oral pills a long time ago.  She would like to use OCPs today.  She has no known contraindications to OCP therapy.  GYNECOLOGIC HISTORY: No LMP recorded. (Menstrual status: IUD). Contraception: Mirena  IUD Menopausal hormone therapy: Premenopausal Last mammogram: Never done due to age Last pap smear:  Diagnosis  Date Value Ref Range Status  08/24/2020   Final   - Negative for intraepithelial lesion or malignancy (NILM)           OB History     Gravida  4   Para  3   Term  3   Preterm      AB  1   Living  3      SAB  1   IAB      Ectopic      Multiple  0   Live Births  3              Patient Active Problem List   Diagnosis Date Noted   Eczema 10/12/2017   Vitamin D  deficiency 08/26/2017   History of C-section 08/17/2017   Family history of colon cancer 06/19/2015   Allergic rhinoconjunctivitis 06/19/2015   Perioral dermatitis 06/19/2015    Past Medical History:  Diagnosis Date   Anemia    Complication of anesthesia    problems getting numb   Scoliosis    Urticaria     Past Surgical History:  Procedure Laterality Date   CESAREAN SECTION     x2   CESAREAN SECTION N/A 02/19/2018   Procedure: REPEAT CESAREAN SECTION;  Surgeon: Lorence Ozell CROME, MD;  Location: [facility];  Service: Obstetrics;  Laterality: N/A;    Current Outpatient Medications  Medication Sig Dispense Refill   cholecalciferol (VITAMIN D3) 25 MCG (1000 UNIT) tablet Take 1,000 Units by mouth daily. (Patient not taking: Reported on 08/26/2024)     EPINEPHrine  0.3 mg/0.3 mL IJ SOAJ injection Inject 0.3 mg into the muscle as needed for anaphylaxis. (Patient  not taking: Reported on 08/26/2024) 2 each 2   famotidine  (PEPCID ) 20 MG tablet Take 1 tablet (20 mg total) by mouth 2 (two) times daily. (Patient not taking: Reported on 08/26/2024) 60 tablet 3   hydrOXYzine (ATARAX) 10 MG tablet Take 10 mg by mouth 3 (three) times daily as needed. (Patient not taking: Reported on 08/26/2024)     ketoconazole  (NIZORAL ) 2 % shampoo With skin, lather and let sit on skin for about 5 minutes before rinsing off once or twice a week (Patient not taking: Reported on 08/26/2024) 120 mL 2   levocetirizine (XYZAL ) 5 MG tablet Take 1 tablet (5 mg total) by mouth in the morning and at bedtime. (Patient not taking: Reported on 08/26/2024) 60 tablet 3   omeprazole (PRILOSEC OTC) 20 MG tablet Take 20 mg by mouth daily. (Patient not taking: Reported on 08/26/2024)     No current facility-administered medications for this visit.     ALLERGIES: Patient has no known allergies.  Family History  Problem Relation Age of Onset   Diabetes Mother    Hypertension Mother    Colon cancer Mother 71   Allergic rhinitis Father  Diabetes Father    Hypertension Father    Allergic rhinitis Sister    Allergic rhinitis Maternal Aunt    Kidney disease Maternal Aunt    Hypertension Maternal Aunt    Diabetes Maternal Aunt    Breast cancer Maternal Aunt 60   Stomach cancer Maternal Aunt    Allergic rhinitis Maternal Uncle    Diabetes Maternal Uncle    Hypertension Maternal Uncle    Cancer Maternal Grandmother    Hypertension Maternal Grandfather    Eczema Daughter    Asthma Daughter    Eczema Daughter    Asthma Daughter     Social History   Socioeconomic History   Marital status: Single    Spouse name: Not on file   Number of children: 3   Years of education: Not on file   Highest education level: Associate degree: occupational, scientist, product/process development, or vocational program  Occupational History   Not on file  Tobacco Use   Smoking status: Never   Smokeless tobacco: Never  Vaping Use    Vaping status: Never Used  Substance and Sexual Activity   Alcohol  use: Yes    Alcohol /week: 0.0 standard drinks of alcohol     Comment: occasionally   Drug use: No   Sexual activity: Yes    Birth control/protection: None, I.U.D.  Other Topics Concern   Not on file  Social History Narrative   Not on file   Social Drivers of Health   Tobacco Use: Low Risk (08/26/2024)   Patient History    Smoking Tobacco Use: Never    Smokeless Tobacco Use: Never    Passive Exposure: Not on file  Financial Resource Strain: Not on file  Food Insecurity: Not on file  Transportation Needs: Not on file  Physical Activity: Not on file  Stress: Not on file  Social Connections: Not on file  Intimate Partner Violence: Not on file  Depression (PHQ2-9): Not on file  Alcohol  Screen: Not on file  Housing: Not on file  Utilities: Not on file  Health Literacy: Not on file    Review of Systems  PHYSICAL EXAMINATION:    BP (!) 128/92   Pulse 78   Ht 5' 1.5 (1.562 m)   Wt 147 lb 9.6 oz (67 kg)   BMI 27.44 kg/m     General appearance: alert, cooperative and appears stated age Head: Normocephalic, without obvious abnormality, atraumatic Neck: no adenopathy, supple, symmetrical, trachea midline and thyroid normal to inspection and palpation Lungs: Normal respiratory effort Abdomen: soft, non-tender, no masses,  no organomegaly Extremities: extremities normal, atraumatic, no cyanosis or edema Skin: Skin color, texture, turgor normal. No rashes or lesions Lymph nodes: No abnormal inguinal nodes palpated Neurologic: Grossly normal  Pelvic: External genitalia:  no lesions              Urethra:  normal appearing urethra with no masses, tenderness or lesions              Bartholins and Skenes: normal                 Vagina: normal appearing vagina with normal color and discharge, no lesions              Cervix: no lesions  Chaperone was present for exam  ASSESSMENT & PLAN   1. Encounter for IUD  removal (Primary) IUD strings not visible in cervical os. Patient is not aware of any time that IUD may have been expelled. Bedside ultrasound shows possible malpositioned  device. Will determine plan after formal pelvic ultrasound. Patient should proceed in the meantime with a backup method of contraception. Sent POPs given elevated BP readings.  - Cervicovaginal ancillary only - US  PELVIC COMPLETE WITH TRANSVAGINAL; Future  2. Elevated blood pressure reading   3. Urinary frequency - POCT Urinalysis Dipstick   An After Visit Summary was printed and given to the patient.   Genelda Roark E Aidden Markovic, PA-C 1/23/20269:21 AM

## 2024-08-26 NOTE — Progress Notes (Signed)
 Pt presents for IUD removal. Mirena  placed 10-15-18. Pt c/o headaches with IUD. Wants to switch to BC pills. Wants to tests for BV, yeast and UTI.  Last PAP 2 months ago with [facility]

## 2024-08-29 ENCOUNTER — Ambulatory Visit: Payer: Self-pay | Admitting: Physician Assistant

## 2024-08-29 MED ORDER — SLYND 4 MG PO TABS: 1.0000 | ORAL_TABLET | Freq: Every day | ORAL | 3 refills | Status: AC

## 2024-08-30 ENCOUNTER — Other Ambulatory Visit: Payer: Self-pay

## 2024-08-30 MED ORDER — FLUCONAZOLE 150 MG PO TABS: 150.0000 mg | ORAL_TABLET | Freq: Once | ORAL | 0 refills | Status: AC

## 2024-08-31 ENCOUNTER — Ambulatory Visit (HOSPITAL_BASED_OUTPATIENT_CLINIC_OR_DEPARTMENT_OTHER)
Admission: RE | Admit: 2024-08-31 | Discharge: 2024-08-31 | Disposition: A | Discharge status: Home / Self Care | Source: Ambulatory Visit | Attending: Physician Assistant | Admitting: Physician Assistant

## 2024-08-31 DIAGNOSIS — Z30432 Encounter for removal of intrauterine contraceptive device: Secondary | ICD-10-CM | POA: Diagnosis present

## 2024-09-23 ENCOUNTER — Ambulatory Visit: Admitting: Allergy

## 2024-10-04 ENCOUNTER — Ambulatory Visit: Payer: Self-pay | Admitting: Obstetrics and Gynecology
